# Patient Record
Sex: Female | Born: 1955 | Race: White | Hispanic: No | Marital: Married | Smoking: Former smoker
Health system: Southern US, Community
[De-identification: ages and names within clinical notes are randomized; demographics above are authoritative.]

## PROBLEM LIST (undated history)

## (undated) DIAGNOSIS — R569 Unspecified convulsions: Secondary | ICD-10-CM

## (undated) DIAGNOSIS — T7840XA Allergy, unspecified, initial encounter: Secondary | ICD-10-CM

## (undated) DIAGNOSIS — G43909 Migraine, unspecified, not intractable, without status migrainosus: Secondary | ICD-10-CM

## (undated) DIAGNOSIS — G40909 Epilepsy, unspecified, not intractable, without status epilepticus: Secondary | ICD-10-CM

## (undated) DIAGNOSIS — M8580 Other specified disorders of bone density and structure, unspecified site: Secondary | ICD-10-CM

## (undated) DIAGNOSIS — M810 Age-related osteoporosis without current pathological fracture: Secondary | ICD-10-CM

## (undated) HISTORY — PX: TUBAL LIGATION: SHX77

## (undated) HISTORY — DX: Unspecified convulsions: R56.9

## (undated) HISTORY — DX: Other specified disorders of bone density and structure, unspecified site: M85.80

## (undated) HISTORY — DX: Migraine, unspecified, not intractable, without status migrainosus: G43.909

## (undated) HISTORY — DX: Age-related osteoporosis without current pathological fracture: M81.0

## (undated) HISTORY — DX: Epilepsy, unspecified, not intractable, without status epilepticus: G40.909

## (undated) HISTORY — DX: Allergy, unspecified, initial encounter: T78.40XA

---

## 2000-07-10 ENCOUNTER — Other Ambulatory Visit: Admission: RE | Admit: 2000-07-10 | Discharge: 2000-07-10 | Payer: Self-pay | Admitting: Obstetrics and Gynecology

## 2009-07-06 ENCOUNTER — Emergency Department (HOSPITAL_COMMUNITY): Admission: EM | Admit: 2009-07-06 | Discharge: 2009-07-06 | Payer: Self-pay | Admitting: Emergency Medicine

## 2009-07-06 IMAGING — CT CT NECK W/ CM
3 series · 16 of 33 positions shown, 19 images · IV contrast (100 ML OMNI 300)
Comparison: None

CLINICAL DATA: Neck pain and swelling.  Difficulty swallowing.
Evaluate for abscess.

CT NECK WITH CONTRAST
TECHNIQUE: Multidetector CT imaging of the neck was performed with
intravenous contrast.
Contrast: 100 ml [3M]

[Series 2: 2cc/(id)/1cc/(id) · axial · 0.51mm/px · z∈[+37,+243]mm · 8 of 65 slices shown, 10 images]
[im 5/65  soft-tissue]
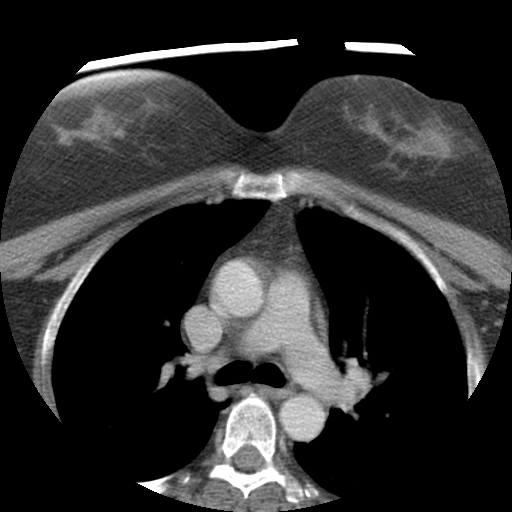
[im 5/65  bone]
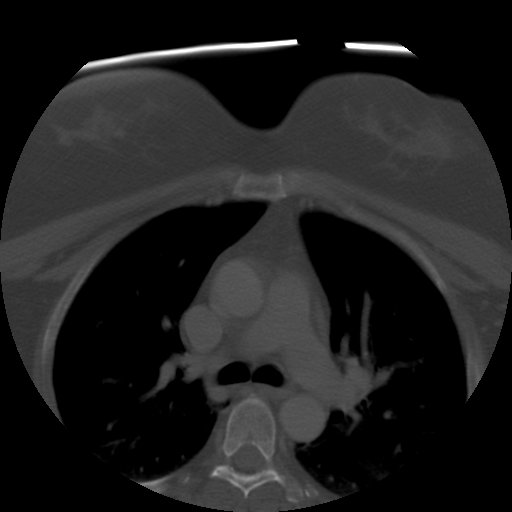
[im 15/65  bone]
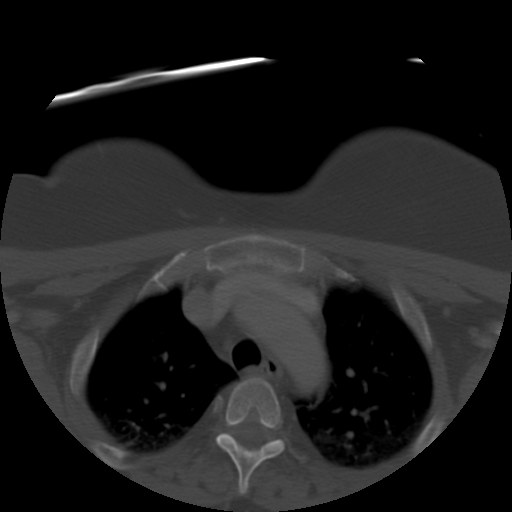
[im 20/65  bone]
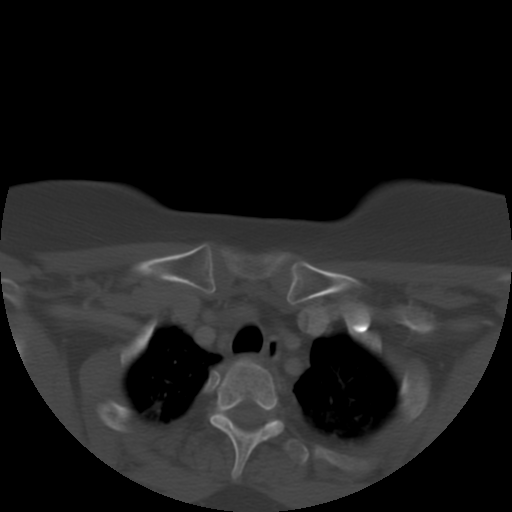
[im 30/65  bone]
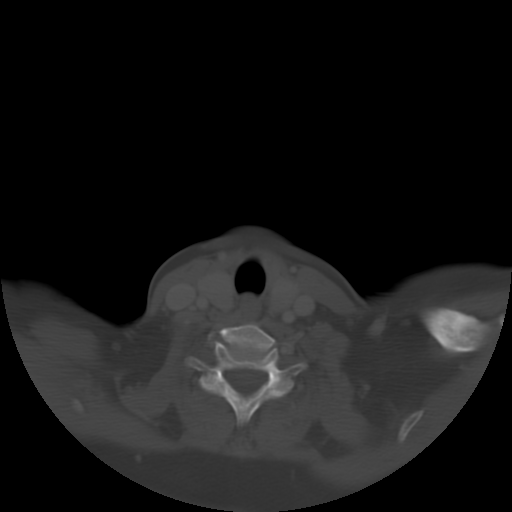
[im 35/65  soft-tissue]
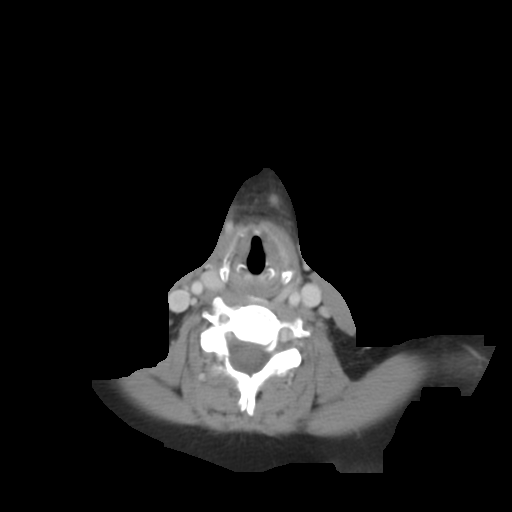
[im 35/65  bone]
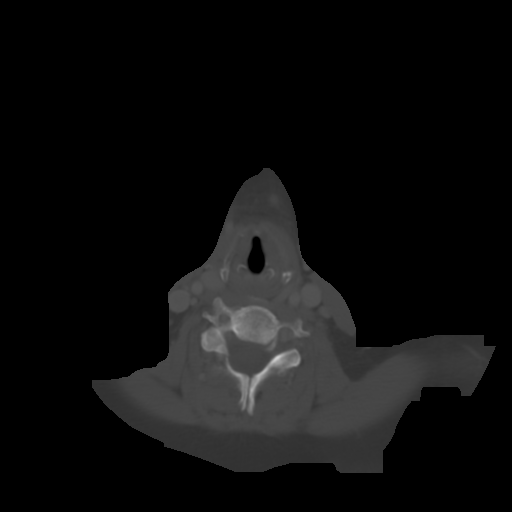
[im 45/65  bone]
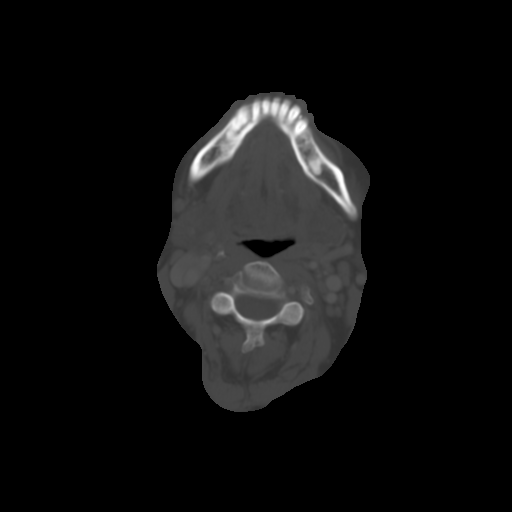
[im 50/65  bone]
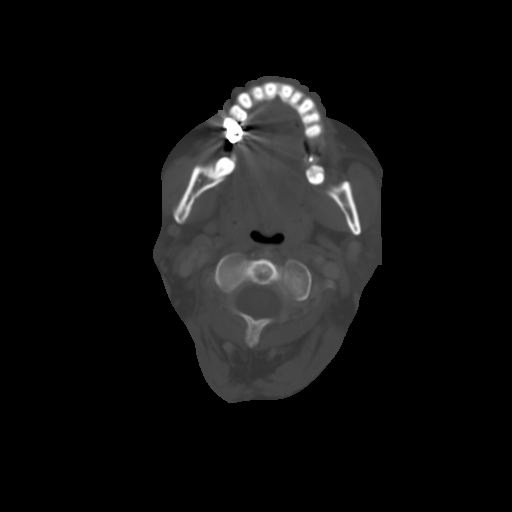
[im 60/65  bone]
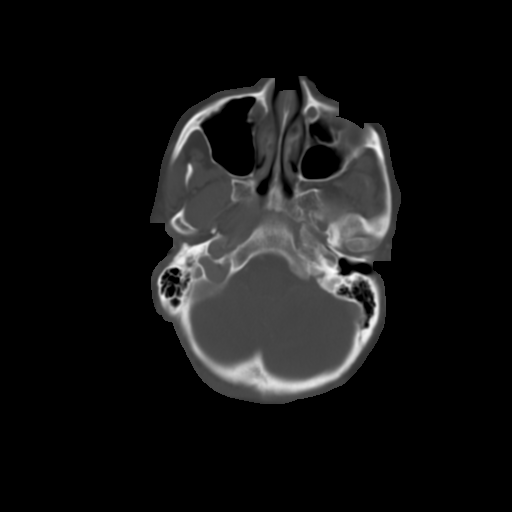

[Series 300: reformatted · sagittal · 0.51mm/px · 5 of 70 slices shown, 6 images (1 of 2)]
[im 24/70  bone]
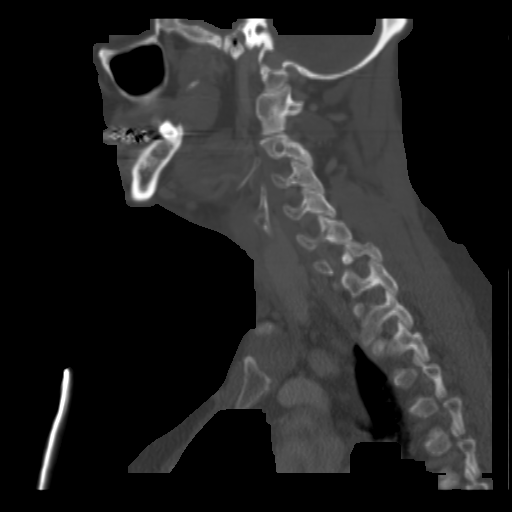
[im 29/70  bone]
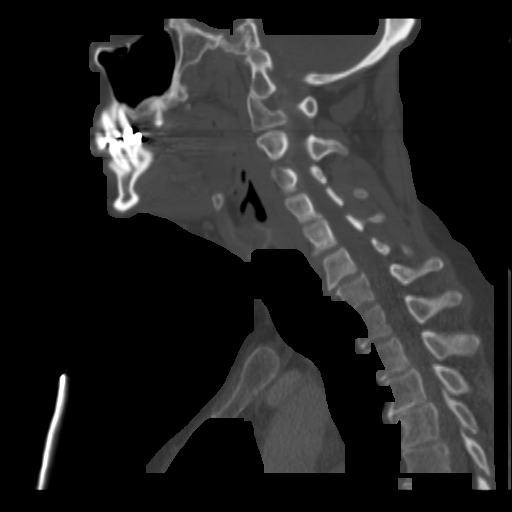
[im 35/70  soft-tissue]
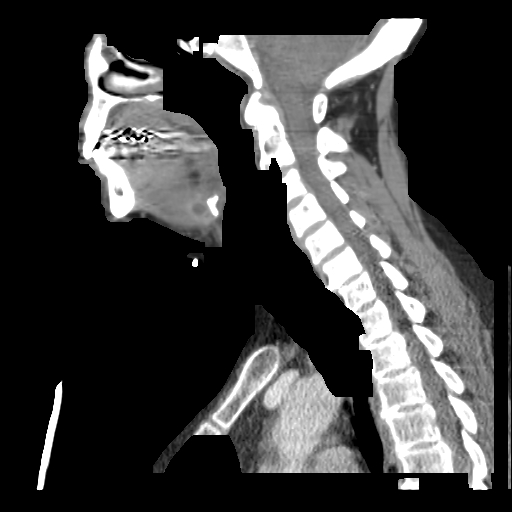
[im 35/70  bone]
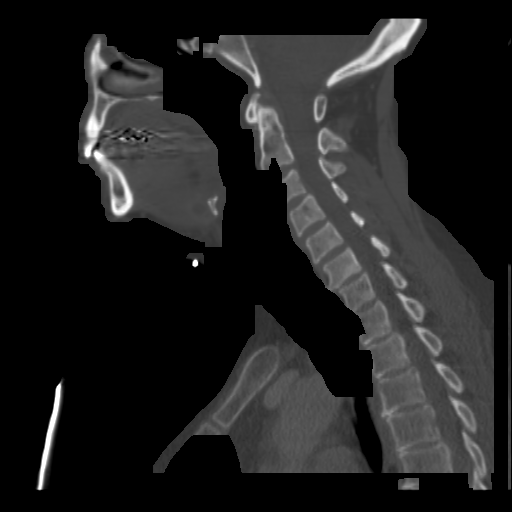
[im 41/70  bone]
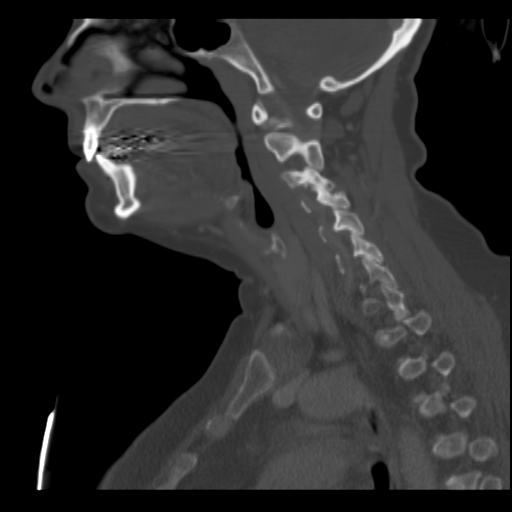
[im 47/70  bone]
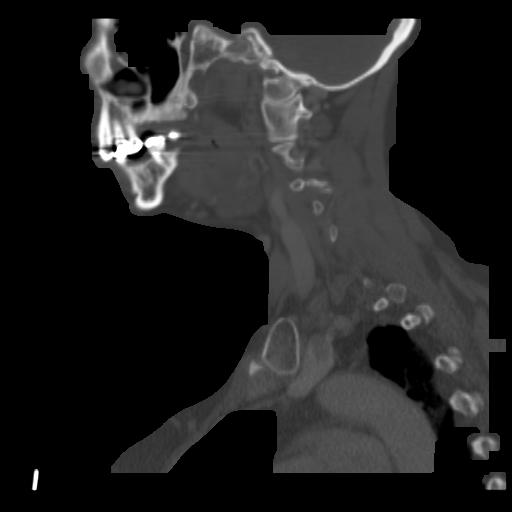

[Series 301: reformatted · coronal · 0.51mm/px · 3 of 93 slices shown (2 of 2)]
[im 19/93  bone]
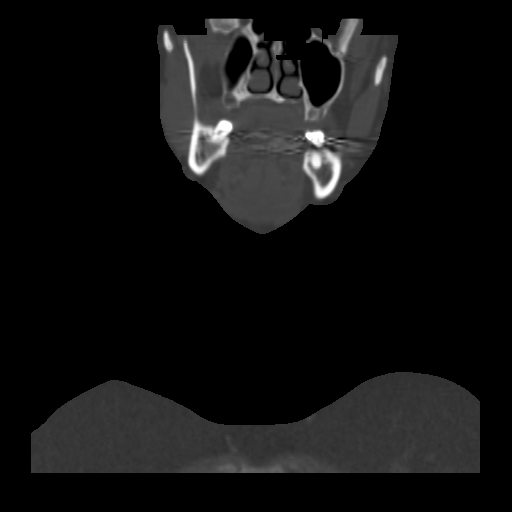
[im 37/93  bone]
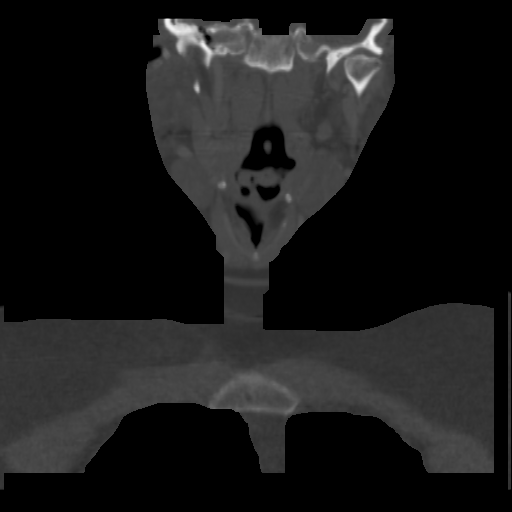
[im 56/93  bone]
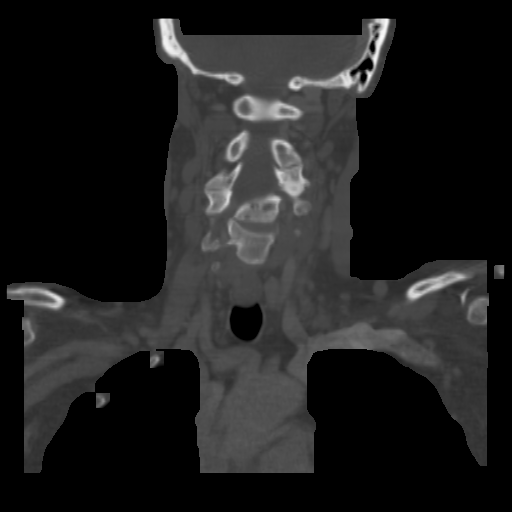

[16 of 33 positions shown; findings below may reference images not displayed]

FINDINGS: A small rim enhancing cyst measuring 1 cm is seen in the
midline along the anterior aspect of the hyoid, consistent with a
thyroglossal duct cyst.

No other abnormal fluid collections or abscess identified within
the neck.  No inflammatory process is identified.  The salivary
glands and thyroid are normal appearance.  The larynx and
epiglottis also appear normal.  Parapharyngeal soft tissue planes
are unremarkable appearance.  Shotty adenopathy is seen along the
jugular and carotid chains bilaterally, however no pathologically
enlarged nodes are identified.
IMPRESSION: 1.  No evidence of abscess.
2.  1 cm midline thyroglossal duct cyst at level of the hyoid bone.

## 2011-01-09 LAB — COMPREHENSIVE METABOLIC PANEL
Calcium: 8.7 mg/dL (ref 8.4–10.5)
Chloride: 105 mEq/L (ref 96–112)
Creatinine, Ser: 0.6 mg/dL (ref 0.4–1.2)
GFR calc Af Amer: >60 mL/min (ref >60)
GFR calc non Af Amer: >60 mL/min (ref >60)
Potassium: 3.5 mEq/L (ref 3.5–5.1)

## 2011-01-09 LAB — POCT I-STAT, CHEM 8
BUN: 12 mg/dL (ref 6–23)
Creatinine, Ser: 0.7 mg/dL (ref 0.4–1.2)
Glucose, Bld: 88 mg/dL (ref 70–99)
Sodium: 138 mEq/L (ref 135–145)
TCO2: 25 mmol/L (ref 0–100)

## 2011-01-09 LAB — DIFFERENTIAL
Basophils Absolute: 0.1 10*3/uL (ref 0.0–0.1)
Basophils Relative: 1 % (ref 0–1)
Eosinophils Absolute: 0.2 10*3/uL (ref 0.0–0.7)
Eosinophils Relative: 2 % (ref 0–5)
Lymphocytes Relative: 21 % (ref 12–46)
Lymphs Abs: 2 10*3/uL (ref 0.7–4.0)
Monocytes Absolute: 1.1 10*3/uL — ABNORMAL HIGH (ref 0.1–1.0)

## 2011-01-09 LAB — CBC
HCT: 36 % (ref 36.0–46.0)
RBC: 3.78 MIL/uL — ABNORMAL LOW (ref 3.87–5.11)
WBC: 9.4 10*3/uL (ref 4.0–10.5)

## 2011-08-08 ENCOUNTER — Other Ambulatory Visit: Payer: Self-pay | Admitting: Nurse Practitioner

## 2011-08-08 DIAGNOSIS — Z79899 Other long term (current) drug therapy: Secondary | ICD-10-CM

## 2011-08-18 ENCOUNTER — Ambulatory Visit
Admission: RE | Admit: 2011-08-18 | Discharge: 2011-08-18 | Disposition: A | Discharge status: Home / Self Care | Payer: 59 | Source: Ambulatory Visit | Attending: Nurse Practitioner | Admitting: Nurse Practitioner

## 2011-08-18 DIAGNOSIS — Z79899 Other long term (current) drug therapy: Secondary | ICD-10-CM

## 2011-10-02 ENCOUNTER — Ambulatory Visit (INDEPENDENT_AMBULATORY_CARE_PROVIDER_SITE_OTHER): Payer: 59

## 2011-10-02 DIAGNOSIS — J019 Acute sinusitis, unspecified: Secondary | ICD-10-CM

## 2011-10-02 DIAGNOSIS — J111 Influenza due to unidentified influenza virus with other respiratory manifestations: Secondary | ICD-10-CM

## 2011-10-13 ENCOUNTER — Ambulatory Visit: Payer: Worker's Compensation

## 2011-10-20 ENCOUNTER — Ambulatory Visit: Payer: Worker's Compensation

## 2011-10-27 ENCOUNTER — Encounter: Payer: Self-pay | Admitting: Family Medicine

## 2011-10-27 DIAGNOSIS — G40909 Epilepsy, unspecified, not intractable, without status epilepticus: Secondary | ICD-10-CM | POA: Insufficient documentation

## 2011-10-27 DIAGNOSIS — J309 Allergic rhinitis, unspecified: Secondary | ICD-10-CM | POA: Insufficient documentation

## 2011-11-16 ENCOUNTER — Ambulatory Visit (INDEPENDENT_AMBULATORY_CARE_PROVIDER_SITE_OTHER): Payer: 59 | Admitting: Physician Assistant

## 2011-11-16 VITALS — BP 117/84 | HR 80 | Temp 98.2°F | Resp 12 | Ht 64.5 in | Wt 201.0 lb

## 2011-11-16 DIAGNOSIS — J019 Acute sinusitis, unspecified: Secondary | ICD-10-CM

## 2011-11-16 DIAGNOSIS — F17200 Nicotine dependence, unspecified, uncomplicated: Secondary | ICD-10-CM

## 2011-11-16 MED ORDER — GUAIFENESIN ER 1200 MG PO TB12: 1.0000 | ORAL_TABLET | Freq: Two times a day (BID) | ORAL | Status: DC | PRN

## 2011-11-16 MED ORDER — CEFDINIR 300 MG PO CAPS: 600.0000 mg | ORAL_CAPSULE | Freq: Every day | ORAL | Status: AC

## 2011-11-16 NOTE — Progress Notes (Signed)
  Subjective:    Patient ID: Chelsea Walsh, female    DOB: [birth year], 56 y.o.   MRN: [id number]  HPI The patient presents with approximately one week of illness. Actually, she states she never completely improved from similar symptoms for which she was seen 10/02/2011. At that visit she was diagnosed with sinusitis and prescribed amoxicillin. She was also prescribed Patanase nasal spray which she used temporarily but has since discontinued. She continues to smoke, has plans to quit smoking this summer.   Congestion, postnasal drainage, cough. Facial pain and pressure. Nasal congestion and drainage.  No GI or GU symptoms. No fever or chills. No sore throat, ear fullness or ear pain. No myalgias, arthralgias or rash. Review of Systems As above, otherwise negative.    Objective:   Physical Exam  Vitals reviewed. Constitutional: She is oriented to person, place, and time. Vital signs are normal. She appears well-developed and well-nourished. No distress.  HENT:  Head: Normocephalic and atraumatic.  Right Ear: Hearing, tympanic membrane, external ear and ear canal normal.  Left Ear: Hearing, tympanic membrane, external ear and ear canal normal.  Nose: Right sinus exhibits maxillary sinus tenderness and frontal sinus tenderness. Left sinus exhibits maxillary sinus tenderness and frontal sinus tenderness.  Mouth/Throat: Uvula is midline, oropharynx is clear and moist and mucous membranes are normal. No oropharyngeal exudate.  Eyes: Conjunctivae and EOM are normal. Pupils are equal, round, and reactive to light. Right eye exhibits no discharge. Left eye exhibits no discharge. No scleral icterus.  Neck: Trachea normal, normal range of motion and full passive range of motion without pain. Neck supple. No mass and no thyromegaly present.  Cardiovascular: Normal rate, regular rhythm and normal heart sounds.   Pulmonary/Chest: Effort normal and breath sounds normal.  Musculoskeletal: Normal range of  motion.       Cervical back: Normal.  Lymphadenopathy:       Head (right side): No tonsillar, no preauricular, no posterior auricular and no occipital adenopathy present.       Head (left side): No tonsillar, no preauricular, no posterior auricular and no occipital adenopathy present.    She has no cervical adenopathy.       Right cervical: No superficial cervical, no deep cervical and no posterior cervical adenopathy present.      Left cervical: No superficial cervical, no deep cervical and no posterior cervical adenopathy present.       Right: No supraclavicular adenopathy present.       Left: No supraclavicular adenopathy present.  Neurological: She is alert and oriented to person, place, and time. No cranial nerve deficit.  Skin: Skin is warm and dry. No rash noted.  Psychiatric: She has a normal mood and affect. Her speech is normal and behavior is normal. Judgment and thought content normal.          Assessment & Plan:  1. Sinusitis. Cefdinir 300 mg, 2 capsules by mouth daily x10 days. Mucinex maximum strength twice a day. Resume 10 days nasal spray. Rest, fluids.  2. Tobacco abuse. Encouraged smoking cessation.

## 2011-11-16 NOTE — Patient Instructions (Signed)
Re-start the Patanase nasal spray you were given for your illness 10/01/2012. Get lots of rest and drink at least 64 ounces of water daily.

## 2012-04-16 ENCOUNTER — Ambulatory Visit: Payer: 59

## 2012-05-03 ENCOUNTER — Encounter: Payer: Self-pay | Admitting: Family Medicine

## 2012-05-03 ENCOUNTER — Ambulatory Visit: Payer: Self-pay | Admitting: Family Medicine

## 2012-05-03 ENCOUNTER — Ambulatory Visit: Payer: Self-pay

## 2012-05-03 VITALS — BP 127/84 | HR 88 | Temp 98.0°F | Resp 16 | Ht 64.0 in | Wt 203.0 lb

## 2012-05-03 DIAGNOSIS — R9389 Abnormal findings on diagnostic imaging of other specified body structures: Secondary | ICD-10-CM

## 2012-05-03 DIAGNOSIS — R918 Other nonspecific abnormal finding of lung field: Secondary | ICD-10-CM

## 2012-05-03 NOTE — Progress Notes (Signed)
[facility] [address], [place] [address] [phone]  Date:  05/03/2012   Name:  Chelsea Walsh   DOB:  03-22-1956   MRN:  [id number]  PCP:  No primary provider on file.    Chief Complaint: xray   History of Present Illness:  Chelsea Walsh is a 56 y.o. very pleasant female patient who presents with the following:  Needs a chest x-ray to follow- up prominent right paratracheal region noted on T-spine film done earlier this month  Patient Active Problem List  Diagnosis  . Allergic rhinitis  . Seizure disorder    Past Medical History  Diagnosis Date  . Epilepsy     last seizure 2000  . Allergy   . Seizures     No past surgical history on file.  History  Substance Use Topics  . Smoking status: Current Everyday Smoker -- 0.7 packs/day for 4 years    Types: Cigarettes  . Smokeless tobacco: Never Used  . Alcohol Use: Not on file    No family history on file.  Allergies  Allergen Reactions  . Tegretol (Carbamazepine) Hives    Medication list has been reviewed and updated.  Current Outpatient Prescriptions on File Prior to Visit  Medication Sig Dispense Refill  . calcium-vitamin D (OSCAL WITH D) 500-200 MG-UNIT per tablet Take 1 tablet by mouth daily.      . Guaifenesin (MUCINEX MAXIMUM STRENGTH) 1200 MG TB12 Take 1 tablet (1,200 mg total) by mouth every 12 (twelve) hours as needed.  14 tablet  1  . levETIRAcetam (KEPPRA) 500 MG tablet Take 100 mg by mouth every 12 (twelve) hours. 1 in am and 2 hs      . Multiple Vitamins-Minerals (MULTIVITAMIN WITH MINERALS) tablet Take 1 tablet by mouth daily.      . phenytoin (DILANTIN) 300 MG ER capsule Take 300 mg by mouth 2 (two) times daily.        Review of Systems:  As per HPI- otherwise negative.   Physical Examination: Filed Vitals:   05/03/12 1516  BP: 127/84  Pulse: 88  Temp: 98 F (36.7 C)  Resp: 16   Filed Vitals:   05/03/12 1516  Height: 5\' 4"  (1.626 m)  Weight: 203 lb (92.08  kg)   Body mass index is 34.84 kg/(m^2). Ideal Body Weight: Weight in (lb) to have BMI = 25: 145.3   GEN: WDWN, NAD, Non-toxic, A & O x 3 HEENT: Atraumatic, Normocephalic. Neck supple. No masses, No LAD. Ears and Nose: No external deformity. CV: RRR, No M/G/R. No JVD. No thrill. No extra heart sounds. PULM: CTA B, no wheezes, crackles, rhonchi. No retractions. No resp. distress. No accessory muscle use. EXTR: No c/c/e NEURO Normal gait.  PSYCH: Normally interactive. Conversant. Not depressed or anxious appearing.  Calm demeanor.    [facility] reading (PRIMARY) by  Dr. Patsy Lager.  Normal CXR- compare with thoracic spine film dated 04/23/12    Assessment and Plan: 1. Abnormal CXR (chest x-ray)  DG Chest 2 View  await over- read report but looks reassuring  COPLAND,JESSICA, MD

## 2013-12-18 ENCOUNTER — Other Ambulatory Visit: Payer: Self-pay | Admitting: Nurse Practitioner

## 2013-12-26 ENCOUNTER — Ambulatory Visit (INDEPENDENT_AMBULATORY_CARE_PROVIDER_SITE_OTHER): Payer: Self-pay | Admitting: Diagnostic Neuroimaging

## 2013-12-26 ENCOUNTER — Encounter (INDEPENDENT_AMBULATORY_CARE_PROVIDER_SITE_OTHER): Payer: Self-pay

## 2013-12-26 ENCOUNTER — Encounter: Payer: Self-pay | Admitting: Diagnostic Neuroimaging

## 2013-12-26 VITALS — BP 131/89 | HR 67 | Ht 65.0 in | Wt 227.0 lb

## 2013-12-26 DIAGNOSIS — G40109 Localization-related (focal) (partial) symptomatic epilepsy and epileptic syndromes with simple partial seizures, not intractable, without status epilepticus: Secondary | ICD-10-CM

## 2013-12-26 MED ORDER — LEVETIRACETAM 500 MG PO TABS: 1500.0000 mg | ORAL_TABLET | Freq: Two times a day (BID) | ORAL | Status: DC

## 2013-12-26 NOTE — Progress Notes (Signed)
[facility]  PATIENT: Chelsea Walsh DOB: 1956/07/27  REFERRING CLINICIAN:  HISTORY FROM: patient  REASON FOR VISIT: follow up   HISTORICAL  CHIEF COMPLAINT:  Chief Complaint  Patient presents with  . Follow-up    sz    HISTORY OF PRESENT ILLNESS:   UPDATE 12/26/13: Patient returns for followup. She's doing well. No further seizures. Patient reports first seizure of life in 1993. Last seizure was in 2001. Patient has been on phenobarbital, Tegretol, Dilantin and Keppra in the past. Currently on Keppra 1500 mg twice a day. Typical seizures consist of olfactory are of ammonia smell followed by staring spell, confusion, sometimes generalized convulsions. She recalls unilateral convulsions at some point in her life, but does not recall what side of her body.  UPDATE 11/17/12: Chelsea Walsh, 58 year old white female returns today for followup. 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 She also has a history of headaches. She returns today  stating that she has had no seizure activity for 13 years. She has been followed at our office since 1993. 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 Rarely have they progressed to generalized seizures. EEG in 1994 an MRI of the brain were interpreted as normal. She has had multiple medication trials in the past including Tegretol for which she developed a rash. She is currently on Dilantin and Keppra without difficulty or side effects. She says that she sometimes does not take her Dilantin because of insurance issues. Her Dexa scan last year shows osteopenia. She is currently taking added calcium and Vitamin D. She denies any confusion or lapses of time, any daytime drowsiness and feelings of being off balance. She does have a thyroid disorder and occasional swelling of the feet, she also has a cyst  in her throat and has caused some  swallowing difficulties. No new neurological complaints.  REVIEW OF SYSTEMS: Full 14 system review of systems performed and notable only for environmental allergies joint pain snoring ringing in ears.  ALLERGIES: Allergies  Allergen Reactions  . Tegretol [Carbamazepine] Hives    HOME MEDICATIONS: Prior to Admission medications   Medication Sig Start Date End Date Taking? Authorizing Provider  calcium-vitamin D (OSCAL WITH D) 500-200 MG-UNIT per tablet Take 1 tablet by mouth daily.   Yes Historical Provider, MD  levETIRAcetam (KEPPRA) 500 MG tablet Take 3 tablets (1,500 mg total) by mouth 2 (two) times daily.   Yes Penni Bombard, MD  Multiple Vitamins-Minerals (MULTIVITAMIN WITH MINERALS) tablet Take 1 tablet by mouth daily.   Yes Historical Provider, MD    PAST MEDICAL HISTORY: Past Medical History  Diagnosis Date  . Epilepsy     last seizure 2000  . Allergy   . Seizures   . Migraine headache   . Osteopenia     PAST SURGICAL HISTORY: History reviewed. No pertinent past surgical history.  FAMILY HISTORY: History reviewed. No pertinent family history.  SOCIAL HISTORY:  History   Social History  . Marital Status: Single    Spouse Name: N/A    Number of Children: 3  . Years of Education: College   Occupational History  . 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  Other    [employer]   Social History Main Topics  . Smoking status: Current Every Day Smoker -- 0.70 packs/day for 4 years  Types: Cigarettes  . Smokeless tobacco: Never Used  . Alcohol Use: No  . Drug Use: No  . Sexual Activity: Not on file   Other Topics Concern  . Not on file   Social History Narrative   Caffeine Use:      PHYSICAL EXAM  Filed Vitals:   12/26/13 1422  BP: 131/89  Pulse: 67  Height: 5' 5"  (1.651 m)  Weight: 227 lb (102.967 kg)    Not recorded    Body mass index is 37.77 kg/(m^2).  GENERAL EXAM: Patient is in no distress; well  developed, nourished and groomed; neck is supple  CARDIOVASCULAR: Regular rate and rhythm, no murmurs, no carotid bruits  NEUROLOGIC: MENTAL STATUS: awake, alert, oriented to person, place and time, recent and remote memory intact, normal attention and concentration, language fluent, comprehension intact, naming intact, fund of knowledge appropriate CRANIAL NERVE: no papilledema on fundoscopic exam, pupils equal and reactive to light, visual fields full to confrontation, extraocular muscles intact, no nystagmus, facial sensation and strength symmetric, hearing intact, palate elevates symmetrically, uvula midline, shoulder shrug symmetric, tongue midline. MOTOR: normal bulk and tone, full strength in the BUE, BLE SENSORY: normal and symmetric to light touch, pinprick, temperature, vibration and proprioception COORDINATION: finger-nose-finger, fine finger movements, heel-shin normal REFLEXES: deep tendon reflexes present and symmetric GAIT/STATION: narrow based gait; able to walk on toes, heels and tandem; romberg is negative    DIAGNOSTIC DATA (LABS, IMAGING, TESTING) - I reviewed patient records, labs, notes, testing and imaging myself where available.  Lab Results  Component Value Date   WBC 9.4 07/06/2009   HGB 13.3 07/06/2009   HCT 39.0 07/06/2009   MCV 95.3 07/06/2009   PLT 253 07/06/2009      Component Value Date/Time   NA 138 07/06/2009 1940   K 3.9 07/06/2009 1940   CL 103 07/06/2009 1940   CO2 26 07/06/2009 1829   GLUCOSE 88 07/06/2009 1940   BUN 12 07/06/2009 1940   CREATININE 0.7 07/06/2009 1940   CALCIUM 8.7 07/06/2009 1829   PROT 6.4 07/06/2009 1829   ALBUMIN 3.8 07/06/2009 1829   AST 17 07/06/2009 1829   ALT 17 07/06/2009 1829   ALKPHOS 161* 07/06/2009 1829   BILITOT 0.5 07/06/2009 1829   GFRNONAA >60 07/06/2009 1829   GFRAA  Value: >60        The eGFR has been calculated using the MDRD equation. This calculation has not been validated in all clinical situations. eGFR's  persistently <60 mL/min signify possible Chronic Kidney Disease. 07/06/2009 1829   No results found for this basename: CHOL, HDL, LDLCALC, LDLDIRECT, TRIG, CHOLHDL   No results found for this basename: HGBA1C   No results found for this basename: VITAMINB12   No results found for this basename: TSH    01/17/93 MRI brain - normal   01/18/93 EEG - normal  03/07/94 EEG - normal  11/03/96 EEG - normal   ASSESSMENT AND PLAN  58 y.o. year old female here with history of complex partial seizures with secondary generalization since 1993. No seizures since 2001. Over past year has done well on monotherapy with levetiracetam 1500 mg twice a day. We'll continue current medication for now. In the future we may consider further tapering. She would be a good candidate given her long seizure free time frame and normal MRI/EEGs. I will continue current plan for now because I am meeting her for the first time today and getting to better know her and her medical/neurologic history.  PLAN: - continue LEV 1573m BID  Return in about 1 year (around 12/27/2014) for with LCharlott Holleror Modell Fendrick.   VPenni Bombard MD [id number]/75/7972 28:20PM Certified in Neurology, Neurophysiology and Neuroimaging  [facility] [address] [place] [phone]

## 2013-12-26 NOTE — Patient Instructions (Signed)
Continue current medications. 

## 2014-08-21 ENCOUNTER — Encounter: Payer: Self-pay | Admitting: Diagnostic Neuroimaging

## 2014-12-27 ENCOUNTER — Ambulatory Visit: Payer: Self-pay | Admitting: Diagnostic Neuroimaging

## 2014-12-27 ENCOUNTER — Ambulatory Visit: Payer: Self-pay | Admitting: Nurse Practitioner

## 2015-02-05 ENCOUNTER — Ambulatory Visit (INDEPENDENT_AMBULATORY_CARE_PROVIDER_SITE_OTHER): Payer: Self-pay | Admitting: Diagnostic Neuroimaging

## 2015-02-05 ENCOUNTER — Encounter: Payer: Self-pay | Admitting: Diagnostic Neuroimaging

## 2015-02-05 VITALS — BP 133/94 | HR 86 | Ht 64.0 in | Wt 241.0 lb

## 2015-02-05 DIAGNOSIS — G40109 Localization-related (focal) (partial) symptomatic epilepsy and epileptic syndromes with simple partial seizures, not intractable, without status epilepticus: Secondary | ICD-10-CM

## 2015-02-05 MED ORDER — LEVETIRACETAM 1000 MG PO TABS: 1000.0000 mg | ORAL_TABLET | Freq: Two times a day (BID) | ORAL | Status: DC

## 2015-02-05 NOTE — Progress Notes (Signed)
[facility]  PATIENT: Chelsea Walsh DOB: October 23, 1955  REFERRING CLINICIAN:  HISTORY FROM: patient  REASON FOR VISIT: follow up   HISTORICAL  CHIEF COMPLAINT:  Chief Complaint  Patient presents with  . Follow-up    epilepsy    HISTORY OF PRESENT ILLNESS:   UPDATE 02/05/15: Since last visit, doing well. No sz. Tolerating LEV 1571m BID. Still working and driving.  UPDATE 12/26/13: Patient returns for followup. She's doing well. No further seizures. Patient reports first seizure of life in 1993. Last seizure was in 2001. Patient has been on phenobarbital, Tegretol, Dilantin and Keppra in the past. Currently on Keppra 1500 mg twice a day. Typical seizures consist of olfactory are of ammonia smell followed by staring spell, confusion, sometimes generalized convulsions. She recalls unilateral convulsions at some point in her life, but does not recall what side of her body.  UPDATE 11/17/12: Chelsea Walsh 59year old white female returns today for followup. 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 She also has a history of headaches. She returns today  stating that she has had no seizure activity for 13 years. She has been followed at our office since 1993. 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 Rarely have they progressed to generalized seizures. EEG in 1994 an MRI of the brain were interpreted as normal. She has had multiple medication trials in the past including Tegretol for which she developed a rash. She is currently on Dilantin and Keppra without difficulty or side effects. She says that she sometimes does not take her Dilantin because of insurance issues. Her Dexa scan last year shows osteopenia. She is currently taking added calcium and Vitamin D. She denies any confusion or lapses of time, any daytime drowsiness and  feelings of being off balance. She does have a thyroid disorder and occasional swelling of the feet, she also has a cyst in her throat and has caused some  swallowing difficulties. No new neurological complaints.  REVIEW OF SYSTEMS: Full 14 system review of systems performed and notable only for joint pain snoring ringing in ears.  ALLERGIES: Allergies  Allergen Reactions  . Tegretol [Carbamazepine] Hives    HOME MEDICATIONS: Outpatient Prescriptions Prior to Visit  Medication Sig Dispense Refill  . calcium-vitamin D (OSCAL WITH D) 500-200 MG-UNIT per tablet Take 1 tablet by mouth daily.    . Multiple Vitamins-Minerals (MULTIVITAMIN WITH MINERALS) tablet Take 1 tablet by mouth daily.    .Marland KitchenlevETIRAcetam (KEPPRA) 500 MG tablet Take 3 tablets (1,500 mg total) by mouth 2 (two) times daily. 540 tablet 4   No facility-administered medications prior to visit.    PAST MEDICAL HISTORY: Past Medical History  Diagnosis Date  . Epilepsy     last seizure 2000  . Allergy   . Seizures   . Migraine headache   . Osteopenia     PAST SURGICAL HISTORY: No past surgical history on file.  FAMILY HISTORY: No family history on file.  SOCIAL HISTORY:  History   Social History  . Marital Status: Single    Spouse Name: Chelsea Walsh . Number of Children: 3  . Years of Education: College   Occupational History  . 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  Other    [employer]   Social History Main Topics  . Smoking status: Former Smoker -- 0.70 packs/day  for 4 years    Types: Cigarettes    Quit date: 02/03/2013  . Smokeless tobacco: Never Used  . Alcohol Use: No  . Drug Use: No  . Sexual Activity: Not on file   Other Topics Concern  . Not on file   Social History Narrative   Lives at home with husband   Drinks caffeine: 1-2 cups of coffee and 1 soda a day      PHYSICAL EXAM  Filed Vitals:   02/05/15 1408  BP: 133/94  Pulse: 86  Height: 5' 4"  (1.626 m)  Weight: 241 lb (109.317  kg)    Not recorded      Body mass index is 41.35 kg/(m^2).  GENERAL EXAM: Patient is in no distress; well developed, nourished and groomed; neck is supple  CARDIOVASCULAR: Regular rate and rhythm, no murmurs, no carotid bruits  NEUROLOGIC: MENTAL STATUS: awake, alert, language fluent, comprehension intact, naming intact, fund of knowledge appropriate CRANIAL NERVE: pupils equal and reactive to light, visual fields full to confrontation, extraocular muscles intact, no nystagmus, facial sensation and strength symmetric, hearing intact, palate elevates symmetrically, uvula midline, shoulder shrug symmetric, tongue midline. MOTOR: normal bulk and tone, full strength in the BUE, BLE SENSORY: normal and symmetric to light touch COORDINATION: finger-nose-finger, fine finger movements normal REFLEXES: deep tendon reflexes present and symmetric GAIT/STATION: narrow based gait; able to walk tandem; romberg is negative    DIAGNOSTIC DATA (LABS, IMAGING, TESTING) - I reviewed patient records, labs, notes, testing and imaging myself where available.  Lab Results  Component Value Date   WBC 9.4 07/06/2009   HGB 13.3 07/06/2009   HCT 39.0 07/06/2009   MCV 95.3 07/06/2009   PLT 253 07/06/2009      Component Value Date/Time   NA 138 07/06/2009 1940   K 3.9 07/06/2009 1940   CL 103 07/06/2009 1940   CO2 26 07/06/2009 1829   GLUCOSE 88 07/06/2009 1940   BUN 12 07/06/2009 1940   CREATININE 0.7 07/06/2009 1940   CALCIUM 8.7 07/06/2009 1829   PROT 6.4 07/06/2009 1829   ALBUMIN 3.8 07/06/2009 1829   AST 17 07/06/2009 1829   ALT 17 07/06/2009 1829   ALKPHOS 161* 07/06/2009 1829   BILITOT 0.5 07/06/2009 1829   GFRNONAA >60 07/06/2009 1829   GFRAA  07/06/2009 1829    >60        The eGFR has been calculated using the MDRD equation. This calculation has not been validated in all clinical situations. eGFR's persistently <60 mL/min signify possible Chronic Kidney Disease.   No  results found for: CHOL No results found for: HGBA1C No results found for: VITAMINB12 No results found for: TSH  01/17/93 MRI brain - normal   01/18/93 EEG - normal  03/07/94 EEG - normal  11/03/96 EEG - normal    ASSESSMENT AND PLAN  59 y.o. year old female here with history of complex partial seizures with secondary generalization since 1993. No seizures since 2001. Has done well on monotherapy with levetiracetam 1500 mg twice a day.   Will try lower LEV 106m BID dosing. Temporary driving restriction x 2 months.   PLAN: - reduce LEV to 1001mBID - no driving until seizure free x 2 months on lower dose  Return in about 6 months (around 08/08/2015).  I spent 15 minutes of face to face time with patient. Greater than 50% of time was spent in counseling and coordination of care with patient.    VIPenni BombardMD  5/0/1586, 8:25 PM Certified in Neurology, Neurophysiology and [facility] [address] [place], [place] [address] [phone]

## 2015-02-05 NOTE — Patient Instructions (Signed)
Reduce levetiracetam to 1000mg  twice a day.  No driving for 2 months. If no seizures, then may return to driving.

## 2015-08-13 ENCOUNTER — Ambulatory Visit: Payer: Self-pay | Admitting: Diagnostic Neuroimaging

## 2016-01-28 ENCOUNTER — Ambulatory Visit: Payer: Self-pay | Admitting: Diagnostic Neuroimaging

## 2016-02-27 ENCOUNTER — Encounter: Payer: Self-pay | Admitting: Diagnostic Neuroimaging

## 2016-02-27 ENCOUNTER — Ambulatory Visit (INDEPENDENT_AMBULATORY_CARE_PROVIDER_SITE_OTHER): Payer: Self-pay | Admitting: Diagnostic Neuroimaging

## 2016-02-27 VITALS — BP 156/96 | HR 75 | Ht 64.0 in | Wt 247.8 lb

## 2016-02-27 DIAGNOSIS — I10 Essential (primary) hypertension: Secondary | ICD-10-CM

## 2016-02-27 DIAGNOSIS — G40109 Localization-related (focal) (partial) symptomatic epilepsy and epileptic syndromes with simple partial seizures, not intractable, without status epilepticus: Secondary | ICD-10-CM

## 2016-02-27 DIAGNOSIS — E669 Obesity, unspecified: Secondary | ICD-10-CM

## 2016-02-27 MED ORDER — LEVETIRACETAM 1000 MG PO TABS: 1000.0000 mg | ORAL_TABLET | Freq: Two times a day (BID) | ORAL | Status: DC

## 2016-02-27 NOTE — Progress Notes (Signed)
[facility]  PATIENT: Chelsea Walsh DOB: 1955-12-15  REFERRING CLINICIAN:  HISTORY FROM: patient  REASON FOR VISIT: follow up   HISTORICAL  CHIEF COMPLAINT:  Chief Complaint  Patient presents with  . Localization-related epilepsy    rm 6, "no seizures in about 16 years"  . Follow-up    yearly    HISTORY OF PRESENT ILLNESS:   UPDATE 02/27/16: Since last visit, doing well. No seizures. Tolerating LEV 1059m BID. Occ anxiety stress issues, but manageble.   UPDATE 02/05/15: Since last visit, doing well. No sz. Tolerating LEV 15059mBID. Still working and driving.  UPDATE 12/26/13: Patient returns for followup. She's doing well. No further seizures. Patient reports first seizure of life in 1993. Last seizure was in 2001. Patient has been on phenobarbital, Tegretol, Dilantin and Keppra in the past. Currently on Keppra 1500 mg twice a day. Typical seizures consist of olfactory are of ammonia smell followed by staring spell, confusion, sometimes generalized convulsions. She recalls unilateral convulsions at some point in her life, but does not recall what side of her body.  UPDATE 11/17/12: [name]60 year old white female returns today for followup. 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 She also has a history of headaches. She returns today  stating that she has had no seizure activity for 13 years. She has been followed at our office since 1993. 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 Rarely have they progressed to generalized seizures. EEG in 1994 an MRI of the brain were interpreted as normal. She has had multiple medication trials in the past including Tegretol for which she developed a rash. She is currently on Dilantin and Keppra without difficulty or side effects. She says that she sometimes does not take her  Dilantin because of insurance issues. Her Dexa scan last year shows osteopenia. She is currently taking added calcium and Vitamin D. She denies any confusion or lapses of time, any daytime drowsiness and feelings of being off balance. She does have a thyroid disorder and occasional swelling of the feet, she also has a cyst in her throat and has caused some  swallowing difficulties. No new neurological complaints.  REVIEW OF SYSTEMS: Full 14 system review of systems performed and negative except: only for joint pain snoring ringing in ears anxiety cramps aching muscles memory loss weight gain.   ALLERGIES: Allergies  Allergen Reactions  . Tegretol [Carbamazepine] Hives    HOME MEDICATIONS: Outpatient Prescriptions Prior to Visit  Medication Sig Dispense Refill  . calcium-vitamin D (OSCAL WITH D) 500-200 MG-UNIT per tablet Take 1 tablet by mouth daily.    . Marland KitchenevETIRAcetam (KEPPRA) 1000 MG tablet Take 1 tablet (1,000 mg total) by mouth 2 (two) times daily. 60 tablet 12  . Multiple Vitamins-Minerals (MULTIVITAMIN WITH MINERALS) tablet Take 1 tablet by mouth daily.     No facility-administered medications prior to visit.    PAST MEDICAL HISTORY: Past Medical History  Diagnosis Date  . [place]    last seizure 2000  . Allergy   . Migraine headache   . Osteopenia   . Seizures ([facility]    02/27/16 about 16 yrs since last sz    PAST SURGICAL HISTORY: No past surgical history on file.  FAMILY HISTORY: No family history on file.  SOCIAL HISTORY:  Social History   Social History  .  Marital Status: Single    Spouse Name: Chelsea Walsh  . Number of Children: 3  . Years of Education: College   Occupational History  . 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  Other    [employer]   Social History Main Topics  . Smoking status: Former Smoker -- 0.70 packs/day for 4 years    Types: Cigarettes    Quit date: 02/03/2013  . Smokeless tobacco: Never Used  . Alcohol Use: No  . Drug Use:  No  . Sexual Activity: Not on file   Other Topics Concern  . Not on file   Social History Narrative   Lives at home with husband   Drinks caffeine: 1-2 cups of coffee and 1 soda a day      PHYSICAL EXAM  Filed Vitals:   02/27/16 1450  BP: 156/96  Pulse: 75  Height: 5' 4"  (1.626 m)  Weight: 247 lb 12.8 oz (112.401 kg)    Not recorded      Body mass index is 42.51 kg/(m^2).  GENERAL EXAM: Patient is in no distress; well developed, nourished and groomed; neck is supple  CARDIOVASCULAR: Regular rate and rhythm, no murmurs, no carotid bruits  NEUROLOGIC: MENTAL STATUS: awake, alert, language fluent, comprehension intact, naming intact, fund of knowledge appropriate CRANIAL NERVE: pupils equal and reactive to light, visual fields full to confrontation, extraocular muscles intact, no nystagmus, facial sensation and strength symmetric, hearing intact, palate elevates symmetrically, uvula midline, shoulder shrug symmetric, tongue midline. MOTOR: normal bulk and tone, full strength in the BUE, BLE SENSORY: normal and symmetric to light touch COORDINATION: finger-nose-finger, fine finger movements normal REFLEXES: deep tendon reflexes present and symmetric GAIT/STATION: narrow based gait; able to walk tandem; romberg is negative    DIAGNOSTIC DATA (LABS, IMAGING, TESTING) - I reviewed patient records, labs, notes, testing and imaging myself where available.  Lab Results  Component Value Date   WBC 9.4 07/06/2009   HGB 13.3 07/06/2009   HCT 39.0 07/06/2009   MCV 95.3 07/06/2009   PLT 253 07/06/2009      Component Value Date/Time   NA 138 07/06/2009 1940   K 3.9 07/06/2009 1940   CL 103 07/06/2009 1940   CO2 26 07/06/2009 1829   GLUCOSE 88 07/06/2009 1940   BUN 12 07/06/2009 1940   CREATININE 0.7 07/06/2009 1940   CALCIUM 8.7 07/06/2009 1829   PROT 6.4 07/06/2009 1829   ALBUMIN 3.8 07/06/2009 1829   AST 17 07/06/2009 1829   ALT 17 07/06/2009 1829   ALKPHOS 161*  07/06/2009 1829   BILITOT 0.5 07/06/2009 1829   GFRNONAA >60 07/06/2009 1829   GFRAA  07/06/2009 1829    >60        The eGFR has been calculated using the MDRD equation. This calculation has not been validated in all clinical situations. eGFR's persistently <60 mL/min signify possible Chronic Kidney Disease.   No results found for: CHOL No results found for: HGBA1C No results found for: VITAMINB12 No results found for: TSH  01/17/93 MRI brain - normal   01/18/93 EEG - normal  03/07/94 EEG - normal  11/03/96 EEG - normal    ASSESSMENT AND PLAN  60 y.o. year old female here with history of complex partial seizures with secondary generalization since 1993. No seizures since 2001. Has done well on monotherapy with levetiracetam.   Dx:  Localization-related epilepsy ([place])  Essential hypertension  Obesity    PLAN: - continue LEV 1073m BID -  Now with increasing BP and increasing obesity. Needs PCP follow up. Advised to check BP at home as well. Consider sleep study.   Meds ordered this encounter  Medications  . levETIRAcetam (KEPPRA) 1000 MG tablet    Sig: Take 1 tablet (1,000 mg total) by mouth 2 (two) times daily.    Dispense:  180 tablet    Refill:  4   Return in about 1 year (around 02/26/2017).   Penni Bombard, MD 8/81/1031, 5:94 PM Certified in Neurology, Neurophysiology and Neuroimaging  [facility] [address], [place], [place] [address] [phone]

## 2016-02-27 NOTE — Patient Instructions (Signed)
Thank you for coming to see [place] at [facility]. I hope we have been able to provide you high quality care today.  You may receive a patient satisfaction survey over the next few weeks. We would appreciate your feedback and comments so that we may continue to improve ourselves and the health of our patients.  - check BP at home - continue levetiracetam   ~~~~~~~~~~~~~~~~~~~~~~~~~~~~~~~~~~~~~~~~~~~~~~~~~~~~~~~~~~~~~~~~~  DR. Ferris Tally'S GUIDE TO HAPPY AND HEALTHY LIVING These are some of my general health and wellness recommendations. Some of them may apply to you better than others. Please use common sense as you try these suggestions and feel free to ask me any questions.   ACTIVITY/FITNESS Mental, social, emotional and physical stimulation are very important for brain and body health. Try learning a new activity (arts, music, language, sports, games).  Keep moving your body to the best of your abilities. You can do this at home, inside or outside, the park, community center, gym or anywhere you like. Consider a physical therapist or personal trainer to get started. Consider the app Sworkit. Fitness trackers such as smart-watches, smart-phones or Fitbits can help as well.   NUTRITION Eat more plants: colorful vegetables, nuts, seeds and berries.  Eat less sugar, salt, preservatives and processed foods.  Avoid toxins such as cigarettes and alcohol.  Drink water when you are thirsty. Warm water with a slice of lemon is an excellent morning drink to start the day.  Consider these websites for more information The Nutrition Source (https://www.henry-hernandez.biz/) Precision Nutrition (WindowBlog.ch)   RELAXATION Consider practicing mindfulness meditation or other relaxation techniques such as deep breathing, prayer, yoga, tai chi, massage. See website mindful.org or the apps Headspace or Calm to help get  started.   SLEEP Try to get at least 7-8+ hours sleep per day. Regular exercise and reduced caffeine will help you sleep better. Practice good sleep hygeine techniques. See website sleep.org for more information.   PLANNING Prepare estate planning, living will, healthcare POA documents. Sometimes this is best planned with the help of an attorney. Theconversationproject.org and agingwithdignity.org are excellent resources.

## 2016-11-10 ENCOUNTER — Ambulatory Visit: Payer: Self-pay

## 2017-02-25 ENCOUNTER — Ambulatory Visit (INDEPENDENT_AMBULATORY_CARE_PROVIDER_SITE_OTHER): Payer: Self-pay | Admitting: Diagnostic Neuroimaging

## 2017-02-25 ENCOUNTER — Encounter: Payer: Self-pay | Admitting: Diagnostic Neuroimaging

## 2017-02-25 ENCOUNTER — Encounter (INDEPENDENT_AMBULATORY_CARE_PROVIDER_SITE_OTHER): Payer: Self-pay

## 2017-02-25 VITALS — BP 156/94 | HR 69 | Wt 241.8 lb

## 2017-02-25 DIAGNOSIS — G40109 Localization-related (focal) (partial) symptomatic epilepsy and epileptic syndromes with simple partial seizures, not intractable, without status epilepticus: Secondary | ICD-10-CM

## 2017-02-25 DIAGNOSIS — E669 Obesity, unspecified: Secondary | ICD-10-CM

## 2017-02-25 DIAGNOSIS — IMO0001 Reserved for inherently not codable concepts without codable children: Secondary | ICD-10-CM

## 2017-02-25 DIAGNOSIS — Z6841 Body Mass Index (BMI) 40.0 and over, adult: Secondary | ICD-10-CM

## 2017-02-25 DIAGNOSIS — I10 Essential (primary) hypertension: Secondary | ICD-10-CM

## 2017-02-25 MED ORDER — LEVETIRACETAM 1000 MG PO TABS: 1000.0000 mg | ORAL_TABLET | Freq: Two times a day (BID) | ORAL | 4 refills | Status: DC

## 2017-02-25 NOTE — Progress Notes (Signed)
[facility]  PATIENT: Chelsea Walsh DOB: August 15, 1956  REFERRING CLINICIAN:  HISTORY FROM: patient  REASON FOR VISIT: follow up   HISTORICAL  CHIEF COMPLAINT:  Chief Complaint  Patient presents with  . Localization-related epilepsy    rm 6, "doing well"  . Follow-up    one year    HISTORY OF PRESENT ILLNESS:   UPDATE 02/25/17: Since last visit, doing well. No seizures. Tolerating LEV 1056m twice a day. Still has not seen primary care due to financial limitations and lack of insurance.   UPDATE 02/27/16: Since last visit, doing well. No seizures. Tolerating LEV 10053mBID. Occ anxiety stress issues, but manageble.   UPDATE 02/05/15: Since last visit, doing well. No sz. Tolerating LEV 150060mID. Still working and driving.  UPDATE 12/26/13: Patient returns for followup. She's doing well. No further seizures. Patient reports first seizure of life in 1993. Last seizure was in 2001. Patient has been on phenobarbital, Tegretol, Dilantin and Keppra in the past. Currently on Keppra 1500 mg twice a day. Typical seizures consist of olfactory are of ammonia smell followed by staring spell, confusion, sometimes generalized convulsions. She recalls unilateral convulsions at some point in her life, but does not recall what side of her body.  UPDATE 11/17/12: [name] returns today for followup. 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 She also has a history of headaches. She returns today  stating that she has had no seizure activity for 13 years. She has been followed at our office since 1993. 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 Rarely have they progressed to generalized seizures. EEG in 1994 an MRI of the brain were interpreted as normal. She has had multiple medication trials in the past including  Tegretol for which she developed a rash. She is currently on Dilantin and Keppra without difficulty or side effects. She says that she sometimes does not take her Dilantin because of insurance issues. Her Dexa scan last year shows osteopenia. She is currently taking added calcium and Vitamin D. She denies any confusion or lapses of time, any daytime drowsiness and feelings of being off balance. She does have a thyroid disorder and occasional swelling of the feet, she also has a cyst in her throat and has caused some  swallowing difficulties. No new neurological complaints.  REVIEW OF SYSTEMS: Full 14 system review of systems performed and negative except: snoring agitation anxiety ringing in ears.   ALLERGIES: Allergies  Allergen Reactions  . Tegretol [Carbamazepine] Hives    HOME MEDICATIONS: Outpatient Medications Prior to Visit  Medication Sig Dispense Refill  . calcium-vitamin D (OSCAL WITH D) 500-200 MG-UNIT per tablet Take 1 tablet by mouth daily.    . lMarland KitchenvETIRAcetam (KEPPRA) 1000 MG tablet Take 1 tablet (1,000 mg total) by mouth 2 (two) times daily. 180 tablet 4  . Multiple Vitamins-Minerals (MULTIVITAMIN WITH MINERALS) tablet Take 1 tablet by mouth daily.     No facility-administered medications prior to visit.     PAST MEDICAL HISTORY: Past Medical History:  Diagnosis Date  . Allergy   . Epilepsy ([facility]  last seizure 2000  . Migraine headache   . Osteopenia   . Seizures ([facility]  02/27/16 about 16 yrs since last sz    PAST SURGICAL HISTORY: History reviewed. No pertinent surgical history.  FAMILY HISTORY:  History reviewed. No pertinent family history.  SOCIAL HISTORY:  Social History   Social History  . Marital status: Single    Spouse name: Gwyndolyn Saxon  . Number of children: 3  . Years of education: College   Occupational History  . 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  Other    [employer]   Social History Main Topics  . Smoking status: Former Smoker     Packs/day: 0.70    Years: 4.00    Types: Cigarettes    Quit date: 02/03/2013  . Smokeless tobacco: Never Used  . Alcohol use No  . Drug use: No  . Sexual activity: Not on file   Other Topics Concern  . Not on file   Social History Narrative   Lives at home with husband   Drinks caffeine: 1-2 cups of coffee and 1 soda a day      PHYSICAL EXAM  Vitals:   02/25/17 0946  BP: (!) 156/94  Pulse: 69  Weight: 241 lb 12.8 oz (109.7 kg)    Not recorded      Body mass index is 41.5 kg/m.  GENERAL EXAM: Patient is in no distress; well developed, nourished and groomed; neck is supple  CARDIOVASCULAR: Regular rate and rhythm, no murmurs, no carotid bruits  NEUROLOGIC: MENTAL STATUS: awake, alert, language fluent, comprehension intact, naming intact, fund of knowledge appropriate CRANIAL NERVE: pupils equal and reactive to light, visual fields full to confrontation, extraocular muscles intact, no nystagmus, facial sensation and strength symmetric, hearing intact, palate elevates symmetrically, uvula midline, shoulder shrug symmetric, tongue midline. MOTOR: normal bulk and tone, full strength in the BUE, BLE SENSORY: normal and symmetric to light touch COORDINATION: finger-nose-finger, fine finger movements normal REFLEXES: deep tendon reflexes present and symmetric GAIT/STATION: narrow based gait; able to walk tandem; romberg is negative    DIAGNOSTIC DATA (LABS, IMAGING, TESTING) - I reviewed patient records, labs, notes, testing and imaging myself where available.  Lab Results  Component Value Date   WBC 9.4 07/06/2009   HGB 13.3 07/06/2009   HCT 39.0 07/06/2009   MCV 95.3 07/06/2009   PLT 253 07/06/2009      Component Value Date/Time   NA 138 07/06/2009 1940   K 3.9 07/06/2009 1940   CL 103 07/06/2009 1940   CO2 26 07/06/2009 1829   GLUCOSE 88 07/06/2009 1940   BUN 12 07/06/2009 1940   CREATININE 0.7 07/06/2009 1940   CALCIUM 8.7 07/06/2009 1829   PROT 6.4  07/06/2009 1829   ALBUMIN 3.8 07/06/2009 1829   AST 17 07/06/2009 1829   ALT 17 07/06/2009 1829   ALKPHOS 161 (H) 07/06/2009 1829   BILITOT 0.5 07/06/2009 1829   GFRNONAA >60 07/06/2009 1829   GFRAA  07/06/2009 1829    >60        The eGFR has been calculated using the MDRD equation. This calculation has not been validated in all clinical situations. eGFR's persistently <60 mL/min signify possible Chronic Kidney Disease.   No results found for: CHOL No results found for: HGBA1C No results found for: VITAMINB12 No results found for: TSH  01/17/93 MRI brain - normal   01/18/93 EEG - normal  03/07/94 EEG - normal  11/03/96 EEG - normal    ASSESSMENT AND PLAN  61 y.o. year old female here with history of complex partial seizures with secondary generalization since 1993. No seizures since 2001. Has done well on monotherapy with levetiracetam.   Dx:  Localization-related  epilepsy [facility])  Essential hypertension  Class 3 obesity with serious comorbidity and body mass index (BMI) of 40.0 to 44.9 in adult, unspecified obesity type ([place])    PLAN: I spent 15 minutes of face to face time with patient. Greater than 50% of time was spent in counseling and coordination of care with patient. In summary we discussed:  - Continue levetiracetam 1072m twice a day - I will refer patient to community health and wellness clinic --> now with increasing BP and increasing obesity  - Advised to monitor BP at home as well - Consider sleep study (snoring, obesity, HTN, fatigue)  Meds ordered this encounter  Medications  . levETIRAcetam (KEPPRA) 1000 MG tablet    Sig: Take 1 tablet (1,000 mg total) by mouth 2 (two) times daily.    Dispense:  180 tablet    Refill:  4   Orders Placed This Encounter  Procedures  . Ambulatory referral to Internal Medicine   Return in about 1 year (around 02/25/2018).   VPenni Bombard MD 50/86/7619 150:93AM Certified in Neurology, Neurophysiology and  Neuroimaging  [facility] [address] [place] [phone]

## 2018-02-23 ENCOUNTER — Ambulatory Visit: Payer: Self-pay | Admitting: Diagnostic Neuroimaging

## 2018-02-26 ENCOUNTER — Ambulatory Visit: Payer: Self-pay | Admitting: Diagnostic Neuroimaging

## 2018-03-18 ENCOUNTER — Telehealth: Payer: Self-pay | Admitting: Diagnostic Neuroimaging

## 2018-03-18 ENCOUNTER — Other Ambulatory Visit: Payer: Self-pay | Admitting: Diagnostic Neuroimaging

## 2018-03-18 MED ORDER — LEVETIRACETAM 1000 MG PO TABS: 1000.0000 mg | ORAL_TABLET | Freq: Two times a day (BID) | ORAL | 4 refills | Status: DC

## 2018-03-18 NOTE — Telephone Encounter (Signed)
Refilled Keppra.

## 2018-03-18 NOTE — Telephone Encounter (Signed)
Pt called stating she only has a weeks worth of levETIRAcetam (KEPPRA) 1000 MG tablet stating she will need enough to last her until upcoming appt.

## 2018-03-29 ENCOUNTER — Ambulatory Visit: Payer: Self-pay | Admitting: Diagnostic Neuroimaging

## 2018-08-17 ENCOUNTER — Ambulatory Visit: Payer: Self-pay | Admitting: Diagnostic Neuroimaging

## 2018-09-07 ENCOUNTER — Ambulatory Visit (INDEPENDENT_AMBULATORY_CARE_PROVIDER_SITE_OTHER): Payer: Self-pay | Admitting: Diagnostic Neuroimaging

## 2018-09-07 ENCOUNTER — Encounter: Payer: Self-pay | Admitting: Diagnostic Neuroimaging

## 2018-09-07 VITALS — BP 149/89 | HR 70 | Ht 64.5 in | Wt 253.4 lb

## 2018-09-07 DIAGNOSIS — G40109 Localization-related (focal) (partial) symptomatic epilepsy and epileptic syndromes with simple partial seizures, not intractable, without status epilepticus: Secondary | ICD-10-CM

## 2018-09-07 DIAGNOSIS — I10 Essential (primary) hypertension: Secondary | ICD-10-CM

## 2018-09-07 MED ORDER — LEVETIRACETAM 1000 MG PO TABS: 1000.0000 mg | ORAL_TABLET | Freq: Two times a day (BID) | ORAL | 4 refills | Status: DC

## 2018-09-07 NOTE — Patient Instructions (Addendum)
SEIZURE DISORDER - continue levetiracetam 1000mg  twice a day  HYPERTENSION (high blood pressure) - follow up with community health and wellness clinic --> BP and obesity - monitor BP at home - consider sleep study

## 2018-09-07 NOTE — Progress Notes (Signed)
[facility]  PATIENT: Chelsea Walsh DOB: 1955-11-13  REFERRING CLINICIAN:  HISTORY FROM: patient  REASON FOR VISIT: follow up   HISTORICAL  CHIEF COMPLAINT:  Chief Complaint  Patient presents with  . Epilepsy    rm 7, "no seizure activity"  . Follow-up    one year    HISTORY OF PRESENT ILLNESS:   UPDATE (09/07/18, VRP): Since last visit, doing well. No seizures. No alleviating or aggravating factors. Tolerating levetiracetam. Still uninsured. Still no PCP. No BP machine at home. Planning to get insurance in 2020 through work.     UPDATE 02/25/17: Since last visit, doing well. No seizures. Tolerating LEV 1071m twice a day. Still has not seen primary care due to financial limitations and lack of insurance.   UPDATE 02/27/16: Since last visit, doing well. No seizures. Tolerating LEV 10062mBID. Occ anxiety stress issues, but manageble.   UPDATE 02/05/15: Since last visit, doing well. No sz. Tolerating LEV 150084mID. Still working and driving.  UPDATE 12/26/13: Patient returns for followup. She's doing well. No further seizures. Patient reports first seizure of life in 1993. Last seizure was in 2001. Patient has been on phenobarbital, Tegretol, Dilantin and Keppra in the past. Currently on Keppra 1500 mg twice a day. Typical seizures consist of olfactory are of ammonia smell followed by staring spell, confusion, sometimes generalized convulsions. She recalls unilateral convulsions at some point in her life, but does not recall what side of her body.  UPDATE 11/17/12: [name] returns today for followup. 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 She also has a history of headaches. She returns today  stating that she has had no seizure activity for 13 years. She has been followed at our office since 1993. 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 Rarely have they progressed to generalized seizures. EEG in 1994 an MRI of the brain were interpreted as normal. She has had multiple medication trials in the past including Tegretol for which she developed a rash. She is currently on Dilantin and Keppra without difficulty or side effects. She says that she sometimes does not take her Dilantin because of insurance issues. Her Dexa scan last year shows osteopenia. She is currently taking added calcium and Vitamin D. She denies any confusion or lapses of time, any daytime drowsiness and feelings of being off balance. She does have a thyroid disorder and occasional swelling of the feet, she also has a cyst in her throat and has caused some  swallowing difficulties. No new neurological complaints.  REVIEW OF SYSTEMS: Full 14 system review of systems performed and negative except: negative.   ALLERGIES: Allergies  Allergen Reactions  . Tegretol [Carbamazepine] Hives    HOME MEDICATIONS: Outpatient Medications Prior to Visit  Medication Sig Dispense Refill  . calcium-vitamin D (OSCAL WITH D) 500-200 MG-UNIT per tablet Take 1 tablet by mouth daily.    . lMarland KitchenvETIRAcetam (KEPPRA) 1000 MG tablet Take 1 tablet (1,000 mg total) by mouth 2 (two) times daily. 180 tablet 4  . Multiple Vitamins-Minerals (MULTIVITAMIN WITH MINERALS) tablet Take 1 tablet by mouth daily.     No facility-administered medications prior to visit.     PAST MEDICAL HISTORY: Past Medical History:  Diagnosis Date  . Allergy   . [place]  [place]  . Migraine headache   .  Osteopenia   . Seizures ([place])    02/27/16 about 16 yrs since last sz    PAST SURGICAL HISTORY: No past surgical history on file.  FAMILY HISTORY: No family history on file.  SOCIAL HISTORY:  Social History   Socioeconomic History  . Marital status: Single    Spouse name: Chelsea Walsh  . Number of children: 3  . Years of education:  College  . Highest education level: Not on file  Occupational History  . Occupation: Theatre manager: OTHER    Comment: Holiday representative: OTHER    Comment: [employer]  Social Needs  . Financial resource strain: Not on file  . Food insecurity:    Worry: Not on file    Inability: Not on file  . Transportation needs:    Medical: Not on file    Non-medical: Not on file  Tobacco Use  . Smoking status: Former Smoker    Packs/day: 0.70    Years: 4.00    Pack years: 2.80    Types: Cigarettes    Last attempt to quit: 02/03/2013    Years since quitting: 5.5  . Smokeless tobacco: Never Used  Substance and Sexual Activity  . Alcohol use: No    Alcohol/week: 0.0 standard drinks  . Drug use: No  . Sexual activity: Not on file  Lifestyle  . Physical activity:    Days per week: Not on file    Minutes per session: Not on file  . Stress: Not on file  Relationships  . Social connections:    Talks on phone: Not on file    Gets together: Not on file    Attends religious service: Not on file    Active member of club or organization: Not on file    Attends meetings of clubs or organizations: Not on file    Relationship status: Not on file  . Intimate partner violence:    Fear of current or ex partner: Not on file    Emotionally abused: Not on file    Physically abused: Not on file    Forced sexual activity: Not on file  Other Topics Concern  . Not on file  Social History Narrative   Lives at home with husband   Drinks caffeine: 1-2 cups of coffee and 1 soda a day      PHYSICAL EXAM  GENERAL EXAM/CONSTITUTIONAL: Vitals:  Vitals:   09/07/18 0805  BP: (!) 149/89  Pulse: 70  Weight: 253 lb 6.4 oz (114.9 kg)  Height: 5' 4.5" (1.638 m)     Body mass index is 42.82 kg/m. Wt Readings from Last 3 Encounters:  09/07/18 253 lb 6.4 oz (114.9 kg)  02/25/17 241 lb 12.8 oz (109.7 kg)  02/27/16 247 lb 12.8 oz (112.4 kg)     Patient is in no distress; well  developed, nourished and groomed; neck is supple  CARDIOVASCULAR:  Examination of carotid arteries is normal; no carotid bruits  Regular rate and rhythm, no murmurs  Examination of peripheral vascular system by observation and palpation is normal  EYES:  Ophthalmoscopic exam of optic discs and posterior segments is normal; no papilledema or hemorrhages  No exam data present  MUSCULOSKELETAL:  Gait, strength, tone, movements noted in Neurologic exam below  NEUROLOGIC: MENTAL STATUS:  No flowsheet data found.  awake, alert, oriented to person, place and time  recent and remote memory intact  normal attention and concentration  language fluent, comprehension intact, naming  intact  fund of knowledge appropriate  CRANIAL NERVE:   2nd - no papilledema on fundoscopic exam  2nd, 3rd, 4th, 6th - pupils equal and reactive to light, visual fields full to confrontation, extraocular muscles intact, no nystagmus  5th - facial sensation symmetric  7th - facial strength symmetric  8th - hearing intact  9th - palate elevates symmetrically, uvula midline  11th - shoulder shrug symmetric  12th - tongue protrusion midline  MOTOR:   normal bulk and tone, full strength in the BUE, BLE  SENSORY:   normal and symmetric to light touch, temperature, vibration  COORDINATION:   finger-nose-finger, fine finger movements normal  REFLEXES:   deep tendon reflexes TRACE and symmetric  GAIT/STATION:   narrow based gait; able to walk tandem      DIAGNOSTIC DATA (LABS, IMAGING, TESTING) - I reviewed patient records, labs, notes, testing and imaging myself where available.  Lab Results  Component Value Date   WBC 9.4 07/06/2009   HGB 13.3 07/06/2009   HCT 39.0 07/06/2009   MCV 95.3 07/06/2009   PLT 253 07/06/2009      Component Value Date/Time   NA 138 07/06/2009 1940   K 3.9 07/06/2009 1940   CL 103 07/06/2009 1940   CO2 26 07/06/2009 1829   GLUCOSE 88  07/06/2009 1940   BUN 12 07/06/2009 1940   CREATININE 0.7 07/06/2009 1940   CALCIUM 8.7 07/06/2009 1829   PROT 6.4 07/06/2009 1829   ALBUMIN 3.8 07/06/2009 1829   AST 17 07/06/2009 1829   ALT 17 07/06/2009 1829   ALKPHOS 161 (H) 07/06/2009 1829   BILITOT 0.5 07/06/2009 1829   GFRNONAA >60 07/06/2009 1829   GFRAA  07/06/2009 1829    >60        The eGFR has been calculated using the MDRD equation. This calculation has not been validated in all clinical situations. eGFR's persistently <60 mL/min signify possible Chronic Kidney Disease.   No results found for: CHOL No results found for: HGBA1C No results found for: VITAMINB12 No results found for: TSH  01/17/93 MRI brain - normal   01/18/93 EEG - normal  03/07/94 EEG - normal  11/03/96 EEG - normal    ASSESSMENT AND PLAN  62 y.o. year old female here with history of complex partial seizures with secondary generalization since 1993. No seizures since 2001. Has done well on monotherapy with levetiracetam.   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 levETIRAcetam (KEPPRA) 1000 MG tablet    Sig: Take 1 tablet (1,000 mg total) by mouth 2 (two) times daily.    Dispense:  180 tablet    Refill:  4   Return in about 10 months (around 07/09/2019).   VPenni Bombard MD 106/12/158 81:09AM Certified in Neurology, Neurophysiology and Neuroimaging  [facility] [address] [place] [phone]

## 2019-07-19 ENCOUNTER — Ambulatory Visit: Payer: Self-pay | Admitting: Diagnostic Neuroimaging

## 2019-08-16 ENCOUNTER — Other Ambulatory Visit: Payer: Self-pay

## 2019-08-16 DIAGNOSIS — Z20822 Contact with and (suspected) exposure to covid-19: Secondary | ICD-10-CM

## 2019-08-18 LAB — NOVEL CORONAVIRUS, NAA: SARS-CoV-2, NAA: NOT DETECTED

## 2019-08-31 ENCOUNTER — Ambulatory Visit: Payer: Self-pay | Admitting: Diagnostic Neuroimaging

## 2019-09-27 ENCOUNTER — Ambulatory Visit: Payer: Self-pay | Admitting: Diagnostic Neuroimaging

## 2019-09-28 ENCOUNTER — Other Ambulatory Visit: Payer: Self-pay | Admitting: Diagnostic Neuroimaging

## 2019-10-11 ENCOUNTER — Encounter: Payer: Self-pay | Admitting: Diagnostic Neuroimaging

## 2019-10-11 ENCOUNTER — Other Ambulatory Visit: Payer: Self-pay

## 2019-10-11 ENCOUNTER — Telehealth (INDEPENDENT_AMBULATORY_CARE_PROVIDER_SITE_OTHER): Payer: Self-pay | Admitting: Diagnostic Neuroimaging

## 2019-10-11 DIAGNOSIS — G40109 Localization-related (focal) (partial) symptomatic epilepsy and epileptic syndromes with simple partial seizures, not intractable, without status epilepticus: Secondary | ICD-10-CM

## 2019-10-11 DIAGNOSIS — I10 Essential (primary) hypertension: Secondary | ICD-10-CM

## 2019-10-11 MED ORDER — LEVETIRACETAM 1000 MG PO TABS: 1000.0000 mg | ORAL_TABLET | Freq: Two times a day (BID) | ORAL | 4 refills | Status: DC

## 2019-10-11 NOTE — Progress Notes (Signed)
Virtual Visit via Video Note  I connected with Chelsea Walsh on 10/11/19 at  9:00 AM EST by a video enabled telemedicine application and verified that I am speaking with the correct person using two identifiers.   I discussed the limitations of evaluation and management by telemedicine and the availability of in person appointments. The patient expressed understanding and agreed to proceed.  Patient is at their home. I am at the office.    History of Present Illness:  UPDATE (10/11/19, VRP):  - doing well; no seizures; tolerating levetiracetam 1000mg  twice a day - still without insurance; has not been able to establish with PCP  UPDATE (09/07/18, VRP): Since last visit, doing well. No seizures. No alleviating or aggravating factors. Tolerating levetiracetam. Still uninsured. Still no PCP. No BP machine at home. Planning to get insurance in 2020 through work.    UPDATE 02/25/17: Since last visit, doing well. No seizures. Tolerating LEV 1000mg  twice a day. Still has not seen primary care due to financial limitations and lack of insurance.   UPDATE 02/27/16: Since last visit, doing well. No seizures. Tolerating LEV 1000mg  BID. Occ anxiety stress issues, but manageble.   UPDATE 02/05/15: Since last visit, doing well. No sz. Tolerating LEV 1500mg  BID. Still working and driving.  UPDATE 12/26/13: Patient returns for followup. She's doing well. No further seizures. Patient reports first seizure of life in 1993. Last seizure was in 2001. Patient has been on phenobarbital, Tegretol, Dilantin and Keppra in the past. Currently on Keppra 1500 mg twice a day. Typical seizures consist of olfactory are of ammonia smell followed by staring spell, confusion, sometimes generalized convulsions. She recalls unilateral convulsions at some point in her life, but does not recall what side of her body.  UPDATE 11/17/12: [name], 64 year old white female returns today for followup. She was last seen 08/08/11.She  has a history of seizure disorder  felt to be complex partial. She also has a history of headaches. She returns today  stating that she has had no seizure activity for 13 years. She has been followed at our office since 1993. 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 Rarely have they progressed to generalized seizures. EEG in 1994 an MRI of the brain were interpreted as normal. She has had multiple medication trials in the past including Tegretol for which she developed a rash. She is currently on Dilantin and Keppra without difficulty or side effects. She says that she sometimes does not take her Dilantin because of insurance issues. Her Dexa scan last year shows osteopenia. She is currently taking added calcium and Vitamin D. She denies any confusion or lapses of time, any daytime drowsiness and feelings of being off balance. She does have a thyroid disorder and occasional swelling of the feet, she also has a cyst in her throat and has caused some  swallowing difficulties. No new neurological complaints.    Observations/Objective:  - awake, alert - lang fluent; comp intact - face symm; no dysarthria   Assessment and Plan:  Dx:  1. 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 Essential hypertension      SEIZURE DISORDER - continue levetiracetam 1000mg  twice a day  HYPERTENSION - follow up with community health and wellness clinic --> BP and obesity - monitor BP at home as well - consider sleep study (snoring, obesity, HTN, fatigue)   Follow Up Instructions:  - Return in about 1  year (around 10/10/2020) for with NP (Amy Lomax).    I discussed the assessment and treatment plan with the patient. The patient was provided an opportunity to ask questions and all were answered. The patient agreed with the plan and demonstrated an understanding of the instructions.   The patient was advised to  call back or seek an in-person evaluation if the symptoms worsen or if the condition fails to improve as anticipated.  I provided 15 minutes of non-face-to-face time during this encounter.   Chelsea Marker, MD 10/11/2019, 9:23 AM Certified in Neurology, Neurophysiology and Neuroimaging  [facility] [address] [place], [place] [address] [phone]

## 2019-12-23 ENCOUNTER — Ambulatory Visit: Payer: Self-pay

## 2020-01-16 ENCOUNTER — Ambulatory Visit: Payer: Self-pay

## 2020-09-13 ENCOUNTER — Other Ambulatory Visit: Payer: Self-pay | Admitting: Diagnostic Neuroimaging

## 2020-10-13 ENCOUNTER — Ambulatory Visit: Payer: Self-pay | Attending: Internal Medicine

## 2020-10-13 DIAGNOSIS — Z23 Encounter for immunization: Secondary | ICD-10-CM

## 2020-10-13 NOTE — Progress Notes (Signed)
   Covid-19 Vaccination Clinic  Name:  Chelsea Walsh    MRN: [id number] DOB: March 15, 1956  10/13/2020  Ms. Chelsea Walsh was observed post Covid-19 immunization for 15 minutes without incident. She was provided with Vaccine Information Sheet and instruction to access the V-Safe system.   Ms. Chelsea Walsh was instructed to call 911 with any severe reactions post vaccine: Marland Kitchen Difficulty breathing  . Swelling of face and throat  . A fast heartbeat  . A bad rash all over body  . Dizziness and weakness   Immunizations Administered    Name Date Dose VIS Date Route   Pfizer COVID-19 Vaccine 10/13/2020  9:55 AM 0.3 mL 07/25/2020 Intramuscular   Manufacturer: [employer], [employer]   Lot: [id number]   NDC: [address]-1

## 2020-11-16 ENCOUNTER — Encounter: Payer: Self-pay | Admitting: Family Medicine

## 2020-11-21 NOTE — Progress Notes (Signed)
PATIENT: Chelsea Walsh DOB: 07-18-1956  REASON FOR VISIT: follow up HISTORY FROM: patient  Virtual Visit via Telephone Note  I connected with Chelsea Walsh on 11/22/20 at  7:45 AM EST by telephone and verified that I am speaking with the correct person using two identifiers.   I discussed the limitations, risks, security and privacy concerns of performing an evaluation and management service by telephone and the availability of in person appointments. I also discussed with the patient that there may be a patient responsible charge related to this service. The patient expressed understanding and agreed to proceed.   History of Present Illness:  11/22/20 ALL:  Chelsea Walsh is a 65 y.o. female here today for follow up. She continues levetiracetam 1000mg  BID. No recent seizure activity. She is feeling well and without complaints. She has not established care with PCP due to pandemic.    History (copied from Dr note)   UPDATE (10/11/19, VRP):  - doing well; no seizures; tolerating levetiracetam 1000mg  twice a day - still without insurance; has not been able to establish with PCP  UPDATE (09/07/18, VRP): Since last visit, doingwell.No seizures.No alleviating or aggravating factors. Toleratinglevetiracetam. Still uninsured. Still no PCP. No BP machine at home. Planning to get insurance in 2020 through work.  UPDATE 02/25/17: Since last visit, doing well. No seizures. Tolerating LEV 1000mg  twice a day. Still has not seen primary care due to financial limitations and lack of insurance.   UPDATE 02/27/16: Since last visit, doing well. No seizures. Tolerating LEV 1000mg  BID. Occ anxiety stress issues, but manageble.   UPDATE 02/05/15: Since last visit, doing well. No sz. Tolerating LEV 1500mg  BID. Still working and driving.  UPDATE 12/26/13: Patient returns for followup. She's doing well. No further seizures. Patient reports first seizure of life in 1993. Last seizure was in 2001.  Patient has been on phenobarbital, Tegretol, Dilantin and Keppra in the past. Currently on Keppra 1500 mg twice a day. Typical seizures consist of olfactory are of ammonia smell followed by staring spell, confusion, sometimes generalized convulsions. She recalls unilateral convulsions at some point in her life, but does not recall what side of her body.  UPDATE 11/17/12: Ms. , 65 year old white female returns today for followup. She was last seen 08/08/11.She has a history of seizure disorder felt to be complex partial. She also has a history of headaches. She returns today stating that she has had no seizure activity for 13 years. She has been followed at our office since 1993. 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 Rarely have they progressed to generalized seizures. EEG in 1994 an MRI of the brain were interpreted as normal. She has had multiple medication trials in the past including Tegretol for which she developed a rash. She is currently on Dilantin and Keppra without difficulty or side effects. She says that she sometimes does not take her Dilantin because of insurance issues. Her Dexa scan last year shows osteopenia. She is currently taking added calcium and Vitamin D. She denies any confusion or lapses of time, any daytime drowsiness and feelings of being off balance. She does have a thyroid disorder and occasional swelling of the feet, she also has a cyst in her throat and has caused some swallowing difficulties. No new neurological complaints.   Observations/Objective:  Generalized: Well developed, in no acute distress  Mentation: Alert oriented to time, place, history taking. Follows all commands  speech and language fluent   Assessment and Plan:  65 y.o. year old female  has a past medical history of Allergy, Epilepsy (HCC), Migraine headache, Osteopenia, and Seizures (HCC). here  with    ICD-10-CM   1. Localization-related epilepsy [facility])  G40.109      Chelsea Walsh is doing well, today. She continues to tolerate levetiracetam 1000mg  BID. No seizures. Refills UTD. She was encouraged to establish care with PCP for annual CPE and blood work. Healthy lifestyle habits encouraged. She will follow up in 1 year, sooner if needed.    No orders of the defined types were placed in this encounter.   No orders of the defined types were placed in this encounter.    Follow Up Instructions:  I discussed the assessment and treatment plan with the patient. The patient was provided an opportunity to ask questions and all were answered. The patient agreed with the plan and demonstrated an understanding of the instructions.   The patient was advised to call back or seek an in-person evaluation if the symptoms worsen or if the condition fails to improve as anticipated.  I provided 15 minutes of non-face-to-face time during this encounter. Patient is passenger in car during Mychart visit, provider is in the office.    , NP

## 2020-11-22 ENCOUNTER — Encounter: Payer: Self-pay | Admitting: Family Medicine

## 2020-11-22 ENCOUNTER — Telehealth (INDEPENDENT_AMBULATORY_CARE_PROVIDER_SITE_OTHER): Payer: Self-pay | Admitting: Family Medicine

## 2020-11-22 ENCOUNTER — Ambulatory Visit: Payer: Self-pay | Admitting: Family Medicine

## 2020-11-22 DIAGNOSIS — G40109 Localization-related (focal) (partial) symptomatic epilepsy and epileptic syndromes with simple partial seizures, not intractable, without status epilepticus: Secondary | ICD-10-CM

## 2020-11-22 NOTE — Progress Notes (Signed)
I reviewed note and agree with plan.   Suanne Marker, MD 11/22/2020, 1:49 PM Certified in Neurology, Neurophysiology and Neuroimaging  [facility] [address], [address] [place], [place] [address] [phone]

## 2020-12-07 ENCOUNTER — Encounter: Payer: Self-pay | Admitting: Family Medicine

## 2020-12-11 ENCOUNTER — Telehealth: Payer: Self-pay | Admitting: Family Medicine

## 2021-09-14 ENCOUNTER — Other Ambulatory Visit: Payer: Self-pay | Admitting: Diagnostic Neuroimaging

## 2021-12-09 NOTE — Patient Instructions (Signed)
Below is our plan: ? ?We will continue levetiracetam 1000mg twice daily.  ? ?Please make sure you are consistent with timing of seizure medication. I recommend annual visit with primary care provider (PCP) for complete physical and routine blood work. We will monitor vitamin D level. I recommend daily intake of vitamin D (400-800iu) and calcium (800-1000mg) for bone health. Discuss Dexa screening with PCP.  ? ?According to [place] law, you can not drive unless you are seizure / syncope free for at least 6 months and under physician's care. ? ?Please maintain precautions. Do not participate in activities where a loss of awareness could harm you or someone else. No swimming alone, no tub bathing, no hot tubs, no driving, no operating motorized vehicles (cars, ATVs, motocycles, etc), lawnmowers, power tools or firearms. No standing at heights, such as rooftops, ladders or stairs. Avoid hot objects such as stoves, heaters, open fires. Wear a helmet when riding a bicycle, scooter, skateboard, etc. and avoid areas of traffic. Set your water heater to 120 degrees or less.  ? ? ?Please make sure you are staying well hydrated. I recommend 50-60 ounces daily. Well balanced diet and regular exercise encouraged. Consistent sleep schedule with 6-8 hours recommended.  ? ?Please continue follow up with care team as directed.  ? ?Follow up with me in 1 year  ? ?You may receive a survey regarding today's visit. I encourage you to leave honest feed back as I do use this information to improve patient care. Thank you for seeing me today!  ? ? ?

## 2021-12-09 NOTE — Progress Notes (Signed)
? ?PATIENT: Chelsea Walsh ?DOB: 20-May-1956 ? ?REASON FOR VISIT: follow up ?HISTORY FROM: patient ? ?Virtual Visit via Telephone Note ? ?I connected with Chelsea Walsh on 12/10/21 at  7:45 AM EST by telephone and verified that I am speaking with the correct person using two identifiers. ?  ?I discussed the limitations, risks, security and privacy concerns of performing an evaluation and management service by telephone and the availability of in person appointments. I also discussed with the patient that there may be a patient responsible charge related to this service. The patient expressed understanding and agreed to proceed. ? ? ?History of Present Illness: ? ?12/10/21 ALL (Mychart):  ?Chelsea Walsh returns for follow up for seizures. She continues to do well on lev 1000mg  BID. She is tolerating meds well with no obvious adverse effects. No seizure activity. Last seizure 2001. She is uninsured.  ? ?11/22/2020 ALL (Mychart): ?Chelsea Walsh is a 66 y.o. female here today for follow up. She continues levetiracetam 1000mg  BID. No recent seizure activity. She is feeling well and without complaints. She has not established care with PCP due to pandemic.  ? ?History (copied from Dr 76 note)  ? ?UPDATE (10/11/19, VRP):  ?- doing well; no seizures; tolerating levetiracetam 1000mg  twice a day ?- still without insurance; has not been able to establish with PCP ?  ?UPDATE (09/07/18, VRP): Since last visit, doing well. No seizures. No alleviating or aggravating factors. Tolerating levetiracetam. Still uninsured. Still no PCP. No BP machine at home. Planning to get insurance in 2020 through work.   ?  ?UPDATE 02/25/17: Since last visit, doing well. No seizures. Tolerating LEV 1000mg  twice a day. Still has not seen primary care due to financial limitations and lack of insurance.  ?  ?UPDATE 02/27/16: Since last visit, doing well. No seizures. Tolerating LEV 1000mg  BID. Occ anxiety stress issues, but manageble.  ?  ?UPDATE 02/05/15: Since last  visit, doing well. No sz. Tolerating LEV 1500mg  BID. Still working and driving. ?  ?UPDATE 12/26/13: Patient returns for followup. She's doing well. No further seizures. Patient reports first seizure of life in 1993. Last seizure was in 2001. Patient has been on phenobarbital, Tegretol, Dilantin and Keppra in the past. Currently on Keppra 1500 mg twice a day. Typical seizures consist of olfactory are of ammonia smell followed by staring spell, confusion, sometimes generalized convulsions. She recalls unilateral convulsions at some point in her life, but does not recall what side of her body. ?  ?UPDATE 11/17/12: Ms. 04/07/15, 66 year old white female returns today for followup. 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 She also has a history of headaches. She returns today  stating that she has had no seizure activity for 13 years. She has been followed at our office since 1993. 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 Rarely have they progressed to generalized seizures. EEG in 1994 an MRI of the brain were interpreted as normal. She has had multiple medication trials in the past including Tegretol for which she developed a rash. She is currently on Dilantin and Keppra without difficulty or side effects. She says that she sometimes does not take her Dilantin because of insurance issues. Her Dexa scan last year shows osteopenia. She is currently taking added calcium and Vitamin D. She denies any confusion or lapses of time, any daytime drowsiness and feelings of being off  balance. She does have a thyroid disorder and occasional swelling of the feet, she also has a cyst in her throat and has caused some  swallowing difficulties. No new neurological complaints. ?  ? ?Observations/Objective: ? ?Generalized: Well developed, in no acute distress  ?Mentation: Alert oriented to  time, place, history taking. Follows all commands speech and language fluent ? ? ?Assessment and Plan: ? ?66 y.o. year old female  has a past medical history of Allergy, Epilepsy (HCC), Migraine headache, Osteopenia, and Seizures (HCC). here with ? ?  ICD-10-CM   ?1. 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  ? ?  ? ?Chelsea Walsh is doing well, today. She continues to tolerate levetiracetam 1000mg  BID. No seizures. Refills UTD. She was encouraged to establish care with PCP for annual CPE and blood work. Healthy lifestyle habits encouraged. She will follow up in 1 year, sooner if needed.  ? ? ?No orders of the defined types were placed in this encounter. ? ? ?No orders of the defined types were placed in this encounter. ? ? ? ?Follow Up Instructions: ? ?I discussed the assessment and treatment plan with the patient. The patient was provided an opportunity to ask questions and all were answered. The patient agreed with the plan and demonstrated an understanding of the instructions. ?  ?The patient was advised to call back or seek an in-person evaluation if the symptoms worsen or if the condition fails to improve as anticipated. ? ?I provided 15 minutes of non-face-to-face time during this encounter. Patient is passenger in car during Mychart visit, provider is in the office.  ? ? ? , NP  ? ? ?

## 2021-12-10 ENCOUNTER — Telehealth (INDEPENDENT_AMBULATORY_CARE_PROVIDER_SITE_OTHER): Payer: Self-pay | Admitting: Family Medicine

## 2021-12-10 ENCOUNTER — Encounter: Payer: Self-pay | Admitting: Family Medicine

## 2021-12-10 DIAGNOSIS — G40109 Localization-related (focal) (partial) symptomatic epilepsy and epileptic syndromes with simple partial seizures, not intractable, without status epilepticus: Secondary | ICD-10-CM

## 2021-12-10 MED ORDER — LEVETIRACETAM 1000 MG PO TABS: 1000.0000 mg | ORAL_TABLET | Freq: Two times a day (BID) | ORAL | 3 refills | Status: DC

## 2022-11-25 ENCOUNTER — Other Ambulatory Visit: Payer: Self-pay | Admitting: *Deleted

## 2022-11-25 MED ORDER — LEVETIRACETAM 1000 MG PO TABS: 1000.0000 mg | ORAL_TABLET | Freq: Two times a day (BID) | ORAL | 0 refills | Status: DC

## 2022-12-17 ENCOUNTER — Other Ambulatory Visit: Payer: Self-pay

## 2022-12-17 DIAGNOSIS — G40109 Localization-related (focal) (partial) symptomatic epilepsy and epileptic syndromes with simple partial seizures, not intractable, without status epilepticus: Secondary | ICD-10-CM

## 2022-12-17 MED ORDER — LEVETIRACETAM 1000 MG PO TABS: 1000.0000 mg | ORAL_TABLET | Freq: Two times a day (BID) | ORAL | 2 refills | Status: DC

## 2023-02-05 NOTE — Progress Notes (Signed)
Chief Complaint  Patient presents with   Follow-up    Pt in room 1, here for epilepsy follow up. Pt reports doing well. No recent seizures. Pt blood pressure is elevated x 2.    HISTORY OF PRESENT ILLNESS:  02/09/23 ALL:  Chelsea Walsh is a 67 y.o. female here today for follow up for seizures. She continues levetiracetam 1000mg  twice daily. She denies seizure activity since 2001. She is tolerating med. She is recently insured with medicare. She is planing to schedule visit with her husband's PCP. She checks BP at home and reports readings are always good. She is planning to have some dental work done. She continues to work full time. She drives without difficulty.   12/10/21 ALL (Mychart):  Chelsea Walsh returns for follow up for seizures. She continues to do well on lev 1000mg  BID. She is tolerating meds well with no obvious adverse effects. No seizure activity. Last seizure 2001. She is uninsured.    11/22/2020 ALL (Mychart): Chelsea Walsh is a 67 y.o. female here today for follow up. She continues levetiracetam 1000mg  BID. No recent seizure activity. She is feeling well and without complaints. She has not established care with PCP due to pandemic.    History (copied from Dr Richrd Humbles note)    UPDATE (10/11/19, VRP):  - doing well; no seizures; tolerating levetiracetam 1000mg  twice a day - still without insurance; has not been able to establish with PCP   UPDATE (09/07/18, VRP): Since last visit, doing well. No seizures. No alleviating or aggravating factors. Tolerating levetiracetam. Still uninsured. Still no PCP. No BP machine at home. Planning to get insurance in 2020 through work.     UPDATE 02/25/17: Since last visit, doing well. No seizures. Tolerating LEV 1000mg  twice a day. Still has not seen primary care due to financial limitations and lack of insurance.    UPDATE 02/27/16: Since last visit, doing well. No seizures. Tolerating LEV 1000mg  BID. Occ anxiety stress issues, but manageble.     UPDATE 02/05/15: Since last visit, doing well. No sz. Tolerating LEV 1500mg  BID. Still working and driving.   UPDATE 12/26/13: Patient returns for followup. She's doing well. No further seizures. Patient reports first seizure of life in 1993. Last seizure was in 2001. Patient has been on phenobarbital, Tegretol, Dilantin and Keppra in the past. Currently on Keppra 1500 mg twice a day. Typical seizures consist of olfactory are of ammonia smell followed by staring spell, confusion, sometimes generalized convulsions. She recalls unilateral convulsions at some point in her life, but does not recall what side of her body.   UPDATE 11/17/12: Chelsea Walsh, 67 year old white female returns today for followup. 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 She also has a history of headaches. She returns today  stating that she has had no seizure activity for 13 years. She has been followed at our office since 1993. 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 Rarely have they progressed to generalized seizures. EEG in 1994 an MRI of the brain were interpreted as normal. She has had multiple medication trials in the past including Tegretol for which she developed a rash. She is currently on Dilantin and Keppra without difficulty or side effects. She says that she sometimes does not take her Dilantin because of insurance issues. Her Dexa scan last year shows osteopenia. She is currently taking added  calcium and Vitamin D. She denies any confusion or lapses of time, any daytime drowsiness and feelings of being off balance. She does have a thyroid disorder and occasional swelling of the feet, she also has a cyst in her throat and has caused some  swallowing difficulties. No new neurological complaints.   REVIEW OF SYSTEMS: Out of a complete 14 system review of symptoms, the patient  complains only of the following symptoms, none and all other reviewed systems are negative.   ALLERGIES: Allergies  Allergen Reactions   Tegretol [Carbamazepine] Hives     HOME MEDICATIONS: Outpatient Medications Prior to Visit  Medication Sig Dispense Refill   calcium-vitamin D (OSCAL WITH D) 500-200 MG-UNIT per tablet Take 1 tablet by mouth daily.     Multiple Vitamins-Minerals (MULTIVITAMIN WITH MINERALS) tablet Take 1 tablet by mouth daily.     levETIRAcetam (KEPPRA) 1000 MG tablet Take 1 tablet (1,000 mg total) by mouth 2 (two) times daily. 180 tablet 2   No facility-administered medications prior to visit.     PAST MEDICAL HISTORY: Past Medical History:  Diagnosis Date   Allergy    Epilepsy (HCC)    last seizure 2000   Migraine headache    Osteopenia    Seizures (HCC)    02/27/16 about 16 yrs since last sz     PAST SURGICAL HISTORY: History reviewed. No pertinent surgical history.   FAMILY HISTORY: History reviewed. No pertinent family history.   SOCIAL HISTORY: Social History   Socioeconomic History   Marital status: Married    Spouse name: Chrissie Noa   Number of children: 3   Years of education: [employer] education level: Not on file  Occupational History   Occupation: Conservation officer, nature: OTHER    Comment: collect and Copywriter, advertising: OTHER    Comment: [employer]  Tobacco Use   Smoking status: Former    Packs/day: 0.70    Years: 4.00    Additional pack years: 0.00    Total pack years: 2.80    Types: Cigarettes    Quit date: 02/03/2013    Years since quitting: 10.0   Smokeless tobacco: Never  Substance and Sexual Activity   Alcohol use: No    Alcohol/week: 0.0 standard drinks of alcohol   Drug use: No   Sexual activity: Not on file  Other Topics Concern   Not on file  Social History Narrative   Lives at home with husband   Drinks caffeine: 1-2 cups of coffee and 1 soda a day    Social Determinants of Health   Financial Resource  Strain: Not on file  Food Insecurity: Not on file  Transportation Needs: Not on file  Physical Activity: Not on file  Stress: Not on file  Social Connections: Not on file  Intimate Partner Violence: Not on file     PHYSICAL EXAM  Vitals:   02/09/23 1303 02/09/23 1307  BP: (!) 149/89 (!) 148/97  Pulse: 71 70  Weight: 230 lb 8 oz (104.6 kg)   Height: 5' 4.5" (1.638 m)    Body mass index is 38.95 kg/m.  Generalized: Well developed, in no acute distress  Cardiology: normal rate and rhythm, no murmur auscultated  Respiratory: clear to auscultation bilaterally    Neurological examination  Mentation: Alert oriented to time, place, history taking. Follows all commands speech and language fluent Cranial nerve II-XII: Pupils were equal round reactive to light. Extraocular movements were full, visual field were  full on confrontational test. Facial sensation and strength were normal. Uvula tongue midline. Head turning and shoulder shrug  were normal and symmetric. Motor: The motor testing reveals 5 over 5 strength of all 4 extremities. Good symmetric motor tone is noted throughout.  Sensory: Sensory testing is intact to soft touch on all 4 extremities. No evidence of extinction is noted.  Coordination: Cerebellar testing reveals good finger-nose-finger and heel-to-shin bilaterally.  Gait and station: Gait is normal. Tandem gait is normal. Romberg is negative. No drift is seen.  Reflexes: Deep tendon reflexes are symmetric and normal bilaterally.    DIAGNOSTIC DATA (LABS, IMAGING, TESTING) - I reviewed patient records, labs, notes, testing and imaging myself where available.  Lab Results  Component Value Date   WBC 9.4 07/06/2009   HGB 13.3 07/06/2009   HCT 39.0 07/06/2009   MCV 95.3 07/06/2009   PLT 253 07/06/2009      Component Value Date/Time   NA 138 07/06/2009 1940   K 3.9 07/06/2009 1940   CL 103 07/06/2009 1940   CO2 26 07/06/2009 1829   GLUCOSE 88 07/06/2009 1940    BUN 12 07/06/2009 1940   CREATININE 0.7 07/06/2009 1940   CALCIUM 8.7 07/06/2009 1829   PROT 6.4 07/06/2009 1829   ALBUMIN 3.8 07/06/2009 1829   AST 17 07/06/2009 1829   ALT 17 07/06/2009 1829   ALKPHOS 161 (H) 07/06/2009 1829   BILITOT 0.5 07/06/2009 1829   GFRNONAA >60 07/06/2009 1829   GFRAA  07/06/2009 1829    >60        The eGFR has been calculated using the MDRD equation. This calculation has not been validated in all clinical situations. eGFR's persistently <60 mL/min signify possible Chronic Kidney Disease.   No results found for: "CHOL", "HDL", "LDLCALC", "LDLDIRECT", "TRIG", "CHOLHDL" No results found for: "HGBA1C" No results found for: "VITAMINB12" No results found for: "TSH"      No data to display               No data to display           ASSESSMENT AND PLAN  67 y.o. year old female  has a past medical history of Allergy, Epilepsy (HCC), Migraine headache, Osteopenia, and Seizures (HCC). here with    Localization-related epilepsy (HCC) - Plan: levETIRAcetam (KEPPRA) 1000 MG tablet  Chelsea Walsh continues to do well. She will continue levetiracetam 1000mg  BID. We have discussed possibility of weaning ASM in the future. Driving restrictions discussed. She will continue to monitor BP at home. I have encouraged her to schedule visit with PCP asap. Healthy lifestyle habits encouraged. She will return to see me in 1 year, sooner if needed. She verbalizes understanding and agreement with this plan.   No orders of the defined types were placed in this encounter.    Meds ordered this encounter  Medications   levETIRAcetam (KEPPRA) 1000 MG tablet    Sig: Take 1 tablet (1,000 mg total) by mouth 2 (two) times daily.    Dispense:  180 tablet    Refill:  3    Order Specific Question:   Supervising Provider    Answer:   Anson Fret [[id number]]     Shawnie Dapper, MSN, FNP-C 02/09/2023, 1:28 PM  [facility] [address] [place], [place] [address] [phone]

## 2023-02-05 NOTE — Patient Instructions (Signed)
Below is our plan:  We will continue levetiracetam 1000mg twice daily   Please make sure you are consistent with timing of seizure medication. I recommend annual visit with primary care provider (PCP) for complete physical and routine blood work. I recommend daily intake of vitamin D (400-800iu) and calcium (800-1000mg) for bone health. Discuss Dexa screening with PCP.   According to [place] law, you can not drive unless you are seizure / syncope free for at least 6 months and under physician's care.  Please maintain precautions. Do not participate in activities where a loss of awareness could harm you or someone else. No swimming alone, no tub bathing, no hot tubs, no driving, no operating motorized vehicles (cars, ATVs, motocycles, etc), lawnmowers, power tools or firearms. No standing at heights, such as rooftops, ladders or stairs. Avoid hot objects such as stoves, heaters, open fires. Wear a helmet when riding a bicycle, scooter, skateboard, etc. and avoid areas of traffic. Set your water heater to 120 degrees or less.  Please make sure you are staying well hydrated. I recommend 50-60 ounces daily. Well balanced diet and regular exercise encouraged. Consistent sleep schedule with 6-8 hours recommended.   Please continue follow up with care team as directed.   Follow up with me in 1 year   You may receive a survey regarding today's visit. I encourage you to leave honest feed back as I do use this information to improve patient care. Thank you for seeing me today!    

## 2023-02-09 ENCOUNTER — Ambulatory Visit (INDEPENDENT_AMBULATORY_CARE_PROVIDER_SITE_OTHER): Payer: Medicare Other | Admitting: Family Medicine

## 2023-02-09 ENCOUNTER — Encounter: Payer: Self-pay | Admitting: Family Medicine

## 2023-02-09 VITALS — BP 148/97 | HR 70 | Ht 64.5 in | Wt 230.5 lb

## 2023-02-09 DIAGNOSIS — G40109 Localization-related (focal) (partial) symptomatic epilepsy and epileptic syndromes with simple partial seizures, not intractable, without status epilepticus: Secondary | ICD-10-CM

## 2023-02-09 MED ORDER — LEVETIRACETAM 1000 MG PO TABS: 1000.0000 mg | ORAL_TABLET | Freq: Two times a day (BID) | ORAL | 3 refills | Status: DC

## 2024-02-09 NOTE — Progress Notes (Signed)
 Chief Complaint  Patient presents with   Follow-up    Pt in 2 Pt here today for seizure f/u Pt states no questions or concerns for today's visit     HISTORY OF PRESENT ILLNESS:  02/15/24 ALL:  Chelsea Walsh returns for follow up for seizures. She continues levetiracetam  1000mg  BID. She is doing well. No seizure activity. She tolerates med without obvious adverse effects. She would like to wean ASM. Last seizure 2001. No recent aura (ammonia odor). Dose reduced from 1500mg  BID to 1000mg  BID in 02/2015 and she tolerated well. BP is usually well managed at home. She hasn't checked it recently. She has follow up with PCP this month. She rarely drives. She continues to work full time. Works with husband and son who can drive.   02/09/2023 ALL:  Chelsea Walsh is a 68 y.o. female here today for follow up for seizures. She continues levetiracetam  1000mg  twice daily. She denies seizure activity since 2001. She is tolerating med. She is recently insured with medicare. She is planing to schedule visit with her husband's PCP. She checks BP at home and reports readings are always good. She is planning to have some dental work done. She continues to work full time. She drives without difficulty.   12/10/21 ALL (Mychart):  Chelsea Walsh returns for follow up for seizures. She continues to do well on lev 1000mg  BID. She is tolerating meds well with no obvious adverse effects. No seizure activity. Last seizure 2001. She is uninsured.    11/22/2020 ALL (Mychart): Chelsea Walsh is a 68 y.o. female here today for follow up. She continues levetiracetam  1000mg  BID. No recent seizure activity. She is feeling well and without complaints. She has not established care with PCP due to pandemic.    History (copied from Dr Elon Hakim note)    UPDATE (10/11/19, VRP):  - doing well; no seizures; tolerating levetiracetam  1000mg  twice a day - still without insurance; has not been able to establish with PCP   UPDATE (09/07/18, VRP): Since last  visit, doing well. No seizures. No alleviating or aggravating factors. Tolerating levetiracetam . Still uninsured. Still no PCP. No BP machine at home. Planning to get insurance in 2020 through work.     UPDATE 02/25/17: Since last visit, doing well. No seizures. Tolerating LEV 1000mg  twice a day. Still has not seen primary care due to financial limitations and lack of insurance.    UPDATE 02/27/16: Since last visit, doing well. No seizures. Tolerating LEV 1000mg  BID. Occ anxiety stress issues, but manageble.    UPDATE 02/05/15: Since last visit, doing well. No sz. Tolerating LEV 1500mg  BID. Still working and driving.   UPDATE 12/26/13: Patient returns for followup. She's doing well. No further seizures. Patient reports first seizure of life in 1993. Last seizure was in 2001. Patient has been on phenobarbital, Tegretol, Dilantin and Keppra  in the past. Currently on Keppra  1500 mg twice a day. Typical seizures consist of olfactory are of ammonia smell followed by staring spell, confusion, sometimes generalized convulsions. She recalls unilateral convulsions at some point in her life, but does not recall what side of her body.   UPDATE 11/17/12: Chelsea Walsh, 68 year old white female returns today for followup. 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 She also has a history of headaches. She returns today  stating that she has had no seizure activity for 13 years. She has been followed at our office since 1993. 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 Rarely have they progressed to generalized seizures. EEG in 1994 an MRI of the brain were interpreted as normal. She has had multiple medication trials in the past including Tegretol for which she developed a rash. She is currently on Dilantin and Keppra  without difficulty or side effects. She says that she sometimes  does not take her Dilantin because of insurance issues. Her Dexa scan last year shows osteopenia. She is currently taking added calcium and Vitamin D. She denies any confusion or lapses of time, any daytime drowsiness and feelings of being off balance. She does have a thyroid disorder and occasional swelling of the feet, she also has a cyst in her throat and has caused some  swallowing difficulties. No new neurological complaints.   REVIEW OF SYSTEMS: Out of a complete 14 system review of symptoms, the patient complains only of the following symptoms, none and all other reviewed systems are negative.   ALLERGIES: Allergies  Allergen Reactions   Tegretol [Carbamazepine] Hives     HOME MEDICATIONS: Outpatient Medications Prior to Visit  Medication Sig Dispense Refill   calcium-vitamin D (OSCAL WITH D) 500-200 MG-UNIT per tablet Take 1 tablet by mouth daily.     Multiple Vitamins-Minerals (MULTIVITAMIN WITH MINERALS) tablet Take 1 tablet by mouth daily.     levETIRAcetam  (KEPPRA ) 1000 MG tablet Take 1 tablet (1,000 mg total) by mouth 2 (two) times daily. 180 tablet 3   No facility-administered medications prior to visit.     PAST MEDICAL HISTORY: Past Medical History:  Diagnosis Date   Allergy    Epilepsy (HCC)    last seizure 2000   Migraine headache    Osteopenia    Seizures (HCC)    02/27/16 about 16 yrs since last sz     PAST SURGICAL HISTORY: History reviewed. No pertinent surgical history.   FAMILY HISTORY: Family History  Problem Relation Age of Onset   Seizures Neg Hx      SOCIAL HISTORY: Social History   Socioeconomic History   Marital status: Married    Spouse name: Sammie Crigler   Number of children: 3   Years of education: College   Highest education level: Not on file  Occupational History   Occupation: Conservation officer, nature: OTHER    Comment: collect and Copywriter, advertising: OTHER    Comment: [employer]  Tobacco Use   Smoking status: Former    Current  packs/day: 0.00    Average packs/day: 0.7 packs/day for 4.0 years (2.8 ttl pk-yrs)    Types: Cigarettes    Start date: 02/03/2009    Quit date: 02/03/2013    Years since quitting: 11.0   Smokeless tobacco: Never  Vaping Use   Vaping status: Not on file  Substance and Sexual Activity   Alcohol use: No    Alcohol/week: 0.0 standard drinks of alcohol   Drug use: No   Sexual activity: Not on file  Other Topics Concern   Not on file  Social History Narrative   Lives at home with husband   Drinks caffeine: 1-2 cups of coffee and 1 soda a day    Social Drivers of Corporate investment banker Strain: Not on file  Food Insecurity: Not on file  Transportation Needs: Not on file  Physical Activity: Not on file  Stress: Not on file  Social Connections: Not on file  Intimate Partner  Violence: Not on file     PHYSICAL EXAM  Vitals:   02/15/24 0946  BP: (!) 157/88  Pulse: 67  Weight: 228 lb (103.4 kg)  Height: 5\' 4"  (1.626 m)    Body mass index is 39.14 kg/m.  Generalized: Well developed, in no acute distress  Cardiology: normal rate and rhythm, no murmur auscultated  Respiratory: clear to auscultation bilaterally    Neurological examination  Mentation: Alert oriented to time, place, history taking. Follows all commands speech and language fluent Cranial nerve II-XII: Pupils were equal round reactive to light. Extraocular movements were full, visual field were full on confrontational test. Facial sensation and strength were normal. Uvula tongue midline. Head turning and shoulder shrug  were normal and symmetric. Motor: The motor testing reveals 5 over 5 strength of all 4 extremities. Good symmetric motor tone is noted throughout.  Sensory: Sensory testing is intact to soft touch on all 4 extremities. No evidence of extinction is noted.  Coordination: Cerebellar testing reveals good finger-nose-finger and heel-to-shin bilaterally.  Gait and station: Gait is normal. Tandem gait is  normal. Romberg is negative. No drift is seen.  Reflexes: Deep tendon reflexes are symmetric and normal bilaterally.    DIAGNOSTIC DATA (LABS, IMAGING, TESTING) - I reviewed patient records, labs, notes, testing and imaging myself where available.  Lab Results  Component Value Date   WBC 9.4 07/06/2009   HGB 13.3 07/06/2009   HCT 39.0 07/06/2009   MCV 95.3 07/06/2009   PLT 253 07/06/2009      Component Value Date/Time   NA 138 07/06/2009 1940   K 3.9 07/06/2009 1940   CL 103 07/06/2009 1940   CO2 26 07/06/2009 1829   GLUCOSE 88 07/06/2009 1940   BUN 12 07/06/2009 1940   CREATININE 0.7 07/06/2009 1940   CALCIUM 8.7 07/06/2009 1829   PROT 6.4 07/06/2009 1829   ALBUMIN 3.8 07/06/2009 1829   AST 17 07/06/2009 1829   ALT 17 07/06/2009 1829   ALKPHOS 161 (H) 07/06/2009 1829   BILITOT 0.5 07/06/2009 1829   GFRNONAA >60 07/06/2009 1829   GFRAA  07/06/2009 1829    >60        The eGFR has been calculated using the MDRD equation. This calculation has not been validated in all clinical situations. eGFR's persistently <60 mL/min signify possible Chronic Kidney Disease.   No results found for: "CHOL", "HDL", "LDLCALC", "LDLDIRECT", "TRIG", "CHOLHDL" No results found for: "HGBA1C" No results found for: "VITAMINB12" No results found for: "TSH"      No data to display               No data to display           ASSESSMENT AND PLAN  68 y.o. year old female  has a past medical history of Allergy, Epilepsy (HCC), Migraine headache, Osteopenia, and Seizures (HCC). 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 She will continue levetiracetam  1000mg  BID for now. I will order EEG. If normal, we will decrease dose to 750mg  BID for 1 month, then 500mg  BID for 1 month, then 250mg  BID for 1 month. If doing well, she will discontinue. She will monitor closely for any concerns of aura or seizure like activity. Driving  restrictions discussed. She will continue to monitor BP at home. She has appt to establish care with PCP this month. Will continue to monitor BP closely. Healthy lifestyle habits encouraged. She will  return to see me in 1 year, sooner if needed. She verbalizes understanding and agreement with this plan.    Orders Placed This Encounter  Procedures   EEG adult    Standing Status:   Future    Expiration Date:   02/14/2025    Where should this test be performed?:   [facility]    Reason for exam:   Seizure     No orders of the defined types were placed in this encounter.   I spent 30 minutes of face-to-face and non-face-to-face time with patient.  This included previsit chart review, lab review, study review, order entry, electronic health record documentation, patient education.   Terrilyn Fick, MSN, FNP-C 02/15/2024, 10:19 AM  [facility] [address] [place], [place] [address] [phone]

## 2024-02-09 NOTE — Patient Instructions (Addendum)
 Below is our plan:  We will continue levetiracetam  1000mg  twice daily until EEG performed and you hear back from me with results. If normal, start levetiracetam  750mg  twice daily for 1 months. If doing well, we will continue to wean by 250mg  every month. Call me when new refill is needed.   Please make sure you are consistent with timing of seizure medication. I recommend annual visit with primary care provider (PCP) for complete physical and routine blood work. I recommend daily intake of vitamin D (400-800iu) and calcium (800-1000mg ) for bone health. Discuss Dexa screening with PCP.   According to [place] law, you can not drive for 6 months   Please maintain precautions. Do not participate in activities where a loss of awareness could harm you or someone else. No swimming alone, no tub bathing, no hot tubs, no driving, no operating motorized vehicles (cars, ATVs, motocycles, etc), lawnmowers, power tools or firearms. No standing at heights, such as rooftops, ladders (unsupervised) or stairs. Avoid hot objects such as stoves, heaters, open fires. Wear a helmet when riding a bicycle, scooter, skateboard, etc. and avoid areas of traffic. Set your water heater to 120 degrees or less.  SUDEP is the sudden, unexpected death of someone with epilepsy, who was otherwise healthy. In SUDEP cases, no other cause of death is found when an autopsy is done. Each year, more than 1 in 1,000 people with epilepsy die from SUDEP. This is the leading cause of death in people with uncontrolled seizures. Until further answers are available, the best way to prevent SUDEP is to lower your risk by controlling seizures. Research has found that people with all types of epilepsy that experience convulsive seizures can be at risk.  Please make sure you are staying well hydrated. I recommend 50-60 ounces daily. Well balanced diet and regular exercise encouraged. Consistent sleep schedule with 6-8 hours recommended.   Please continue  follow up with care team as directed.   Follow up with me in 6 months   You may receive a survey regarding today's visit. I encourage you to leave honest feed back as I do use this information to improve patient care. Thank you for seeing me today!

## 2024-02-15 ENCOUNTER — Encounter: Payer: Self-pay | Admitting: Family Medicine

## 2024-02-15 ENCOUNTER — Ambulatory Visit (INDEPENDENT_AMBULATORY_CARE_PROVIDER_SITE_OTHER): Payer: Medicare Other | Admitting: Family Medicine

## 2024-02-15 VITALS — BP 157/88 | HR 67 | Ht 64.0 in | Wt 228.0 lb

## 2024-02-15 DIAGNOSIS — G40109 Localization-related (focal) (partial) symptomatic epilepsy and epileptic syndromes with simple partial seizures, not intractable, without status epilepticus: Secondary | ICD-10-CM | POA: Diagnosis not present

## 2024-02-16 ENCOUNTER — Ambulatory Visit (INDEPENDENT_AMBULATORY_CARE_PROVIDER_SITE_OTHER): Admitting: Diagnostic Neuroimaging

## 2024-02-16 DIAGNOSIS — G40109 Localization-related (focal) (partial) symptomatic epilepsy and epileptic syndromes with simple partial seizures, not intractable, without status epilepticus: Secondary | ICD-10-CM

## 2024-02-22 ENCOUNTER — Encounter: Payer: Self-pay | Admitting: Family Medicine

## 2024-02-22 MED ORDER — LEVETIRACETAM 1000 MG PO TABS: 1000.0000 mg | ORAL_TABLET | Freq: Two times a day (BID) | ORAL | 3 refills | Status: AC

## 2024-03-02 NOTE — Procedures (Signed)
   [facility]  EEG (ELECTROENCEPHALOGRAM) REPORT   STUDY DATE: 03/02/24 PATIENT NAME: Chelsea Walsh DOB: [birth year] MRN: [id number]  ORDERING CLINICIAN: Gwendloyn Lemming, MD   TECHNOLOGIST: Author Board TECHNIQUE: Electroencephalogram was recorded utilizing standard 10-20 system of lead placement and reformatted into average and bipolar montages.  RECORDING TIME: 26 minutes ACTIVATION: hyperventilation and photic stimulation  CLINICAL INFORMATION: 68 year old female with seizure.  FINDINGS: Posterior dominant background rhythms, which attenuate with eye opening, ranging 10-11 hertz and 30-35 microvolts. No focal, lateralizing, epileptiform activity or seizures are seen. Patient recorded in the awake and drowsy state. EKG channel shows regular rhythm of 60-65 beats per minute.   IMPRESSION:   Normal EEG in the awake and drowsy states.    INTERPRETING PHYSICIAN:  Omega Bible, MD Certified in Neurology, Neurophysiology and Neuroimaging  [facility] [address] [place], [place] [address] [phone]

## 2024-03-03 ENCOUNTER — Encounter: Payer: Self-pay | Admitting: Nurse Practitioner

## 2024-03-03 ENCOUNTER — Ambulatory Visit (INDEPENDENT_AMBULATORY_CARE_PROVIDER_SITE_OTHER): Admitting: Nurse Practitioner

## 2024-03-03 ENCOUNTER — Ambulatory Visit: Payer: Self-pay | Admitting: Family Medicine

## 2024-03-03 VITALS — BP 124/86 | HR 63 | Temp 97.3°F | Ht 64.0 in | Wt 227.2 lb

## 2024-03-03 DIAGNOSIS — G40909 Epilepsy, unspecified, not intractable, without status epilepticus: Secondary | ICD-10-CM

## 2024-03-03 DIAGNOSIS — Z136 Encounter for screening for cardiovascular disorders: Secondary | ICD-10-CM | POA: Diagnosis not present

## 2024-03-03 DIAGNOSIS — G8929 Other chronic pain: Secondary | ICD-10-CM | POA: Insufficient documentation

## 2024-03-03 DIAGNOSIS — R030 Elevated blood-pressure reading, without diagnosis of hypertension: Secondary | ICD-10-CM | POA: Diagnosis not present

## 2024-03-03 DIAGNOSIS — Z1211 Encounter for screening for malignant neoplasm of colon: Secondary | ICD-10-CM

## 2024-03-03 DIAGNOSIS — M8580 Other specified disorders of bone density and structure, unspecified site: Secondary | ICD-10-CM | POA: Diagnosis not present

## 2024-03-03 DIAGNOSIS — R002 Palpitations: Secondary | ICD-10-CM | POA: Diagnosis not present

## 2024-03-03 DIAGNOSIS — E559 Vitamin D deficiency, unspecified: Secondary | ICD-10-CM | POA: Diagnosis not present

## 2024-03-03 DIAGNOSIS — M25561 Pain in right knee: Secondary | ICD-10-CM

## 2024-03-03 DIAGNOSIS — Z780 Asymptomatic menopausal state: Secondary | ICD-10-CM

## 2024-03-03 DIAGNOSIS — M25562 Pain in left knee: Secondary | ICD-10-CM

## 2024-03-03 DIAGNOSIS — Z1231 Encounter for screening mammogram for malignant neoplasm of breast: Secondary | ICD-10-CM

## 2024-03-03 LAB — COMPREHENSIVE METABOLIC PANEL WITH GFR
ALT: 14 U/L (ref 0–35)
AST: 15 U/L (ref 0–37)
Albumin: 4.4 g/dL (ref 3.5–5.2)
Alkaline Phosphatase: 135 U/L — ABNORMAL HIGH (ref 39–117)
BUN: 14 mg/dL (ref 6–23)
CO2: 29 meq/L (ref 19–32)
Calcium: 9.5 mg/dL (ref 8.4–10.5)
Chloride: 105 meq/L (ref 96–112)
Creatinine, Ser: 0.65 mg/dL (ref 0.40–1.20)
GFR: 91.2 mL/min (ref >60.00)
Glucose, Bld: 90 mg/dL (ref 70–99)
Potassium: 4.3 meq/L (ref 3.5–5.1)
Sodium: 141 meq/L (ref 135–145)
Total Bilirubin: 0.3 mg/dL (ref 0.2–1.2)
Total Protein: 6.9 g/dL (ref 6.0–8.3)

## 2024-03-03 LAB — CBC WITH DIFFERENTIAL/PLATELET
Basophils Absolute: 0.1 10*3/uL (ref 0.0–0.1)
Basophils Relative: 0.8 % (ref 0.0–3.0)
Eosinophils Absolute: 0.1 10*3/uL (ref 0.0–0.7)
Eosinophils Relative: 2.4 % (ref 0.0–5.0)
HCT: 40.2 % (ref 36.0–46.0)
Hemoglobin: 13.4 g/dL (ref 12.0–15.0)
Lymphocytes Relative: 28 % (ref 12.0–46.0)
Lymphs Abs: 1.8 10*3/uL (ref 0.7–4.0)
MCHC: 33.2 g/dL (ref 30.0–36.0)
MCV: 91.1 fl (ref 78.0–100.0)
Monocytes Absolute: 0.6 10*3/uL (ref 0.1–1.0)
Monocytes Relative: 9.4 % (ref 3.0–12.0)
Neutro Abs: 3.7 10*3/uL (ref 1.4–7.7)
Neutrophils Relative %: 59.4 % (ref 43.0–77.0)
Platelets: 294 10*3/uL (ref 150.0–400.0)
RBC: 4.42 Mil/uL (ref 3.87–5.11)
RDW: 12.9 % (ref 11.5–15.5)
WBC: 6.2 10*3/uL (ref 4.0–10.5)

## 2024-03-03 LAB — VITAMIN D 25 HYDROXY (VIT D DEFICIENCY, FRACTURES): VITD: <7 ng/mL — ABNORMAL LOW (ref 30.00–100.00)

## 2024-03-03 LAB — LIPID PANEL
Cholesterol: 162 mg/dL (ref 0–200)
HDL: 51.5 mg/dL (ref >39.00)
LDL Cholesterol: 86 mg/dL (ref 0–99)
NonHDL: 110.58
Total CHOL/HDL Ratio: 3
Triglycerides: 124 mg/dL (ref 0.0–149.0)
VLDL: 24.8 mg/dL (ref 0.0–40.0)

## 2024-03-03 LAB — TSH: TSH: 1.66 u[IU]/mL (ref 0.35–5.50)

## 2024-03-03 NOTE — Progress Notes (Signed)
 New Patient Visit  BP 124/86 (BP Location: Left Arm, Patient Position: Sitting, Cuff Size: Normal)   Pulse 63   Temp (!) 97.3 F (36.3 C)   Ht 5\' 4"  (1.626 m)   Wt 227 lb 3.2 oz (103.1 kg)   SpO2 96%   BMI 39.00 kg/m    Subjective:    Patient ID: Chelsea Walsh, female    DOB: 1956-09-14, 68 y.o.   MRN: [id number]  CC: Chief Complaint  Patient presents with   Establish Care    NP. Est. Care, concerns with elevated blood pressure    HPI: Chelsea Walsh is a 68 y.o. female presents for new patient visit to establish care.  Introduced to Publishing rights manager role and practice setting.  All questions answered.  Discussed provider/patient relationship and expectations.  Discussed the use of AI scribe software for clinical note transcription with the patient, who gave verbal consent to proceed.  History of Present Illness   Chelsea Walsh is a 68 year old female who presents to establish care.  She has a history of seizures, with the last episode in 2001, and is on Keppra  1000 mg twice daily. A recent EEG was normal. She follows regularly with neurology and is discussing weaning off keppra  since she hasn't had any seizures recently.   She has osteopenia and takes calcium and vitamin D supplements. Her last bone density scan was in 2012.  Her blood pressure is intermittently elevated, especially during medical and dental visits. She experiences dizziness and lightheadedness upon standing, with occasional palpitations at night. She denies chest pain or shortness of breath.  She experiences frequent headaches, managed with Tylenol or ibuprofen, particularly during computer work. She feels tired and attributes this to her age.  She has joint pain, especially in her knees and a previously broken ankle, which has started to hurt again. She attributes this to age and weight.  She experiences occasional anxiety and stress related to her husband's health but denies frequent panic attacks. She  sometimes feels down or sad, but not consistently. She quit smoking in 2013 and does not consume alcohol. She has a family history of diabetes.        03/03/2024   10:16 AM  Depression screen PHQ 2/9  Decreased Interest 0  Down, Depressed, Hopeless 0  PHQ - 2 Score 0  Altered sleeping 0  Tired, decreased energy 0  Change in appetite 0  Feeling bad or failure about yourself  0  Trouble concentrating 0  Moving slowly or fidgety/restless 0  Suicidal thoughts 0  PHQ-9 Score 0  Difficult doing work/chores Not difficult at all      03/03/2024   10:16 AM  GAD 7 : Generalized Anxiety Score  Nervous, Anxious, on Edge 0  Control/stop worrying 0  Worry too much - different things 0  Trouble relaxing 0  Restless 0  Easily annoyed or irritable 1  Afraid - awful might happen 1  Total GAD 7 Score 2  Anxiety Difficulty Not difficult at all    Past Medical History:  Diagnosis Date   Allergy    Epilepsy (HCC)    last seizure 2000   Migraine headache    Osteopenia    Seizures (HCC)    02/27/16 about 16 yrs since last sz    Past Surgical History:  Procedure Laterality Date   CESAREAN SECTION  1979   First pregnancy   TUBAL LIGATION  1989   Last pregnancy    Family History  Problem Relation Age of Onset   Alcohol abuse Father    Diabetes Father    Seizures Neg Hx      Social History   Tobacco Use   Smoking status: Former    Current packs/day: 0.00    Average packs/day: 0.7 packs/day for 4.0 years (2.8 ttl pk-yrs)    Types: Cigarettes    Start date: 02/03/2009    Quit date: 02/03/2013    Years since quitting: 11.0   Smokeless tobacco: Never   Tobacco comments:    Was never a heavy smoker and have not smoked since 2013.  Substance Use Topics   Alcohol use: Not Currently   Drug use: No    Current Outpatient Medications on File Prior to Visit  Medication Sig Dispense Refill   diphenhydrAMINE (BENADRYL ALLERGY) 25 mg capsule Take 25 mg by mouth at bedtime.      Ibuprofen-diphenhydrAMINE Cit (IBUPROFEN PM PO) Take by mouth at bedtime.     levETIRAcetam  (KEPPRA ) 1000 MG tablet Take 1 tablet (1,000 mg total) by mouth 2 (two) times daily. 180 tablet 3   Multiple Vitamins-Minerals (MULTIVITAMIN WITH MINERALS) tablet Take 1 tablet by mouth daily.     calcium-vitamin D  (OSCAL WITH D) 500-200 MG-UNIT per tablet Take 1 tablet by mouth daily. (Patient not taking: Reported on 03/03/2024)     No current facility-administered medications on file prior to visit.     Review of Systems  Constitutional:  Positive for fatigue. Negative for fever.  HENT: Negative.    Eyes: Negative.   Respiratory: Negative.    Cardiovascular:  Positive for palpitations (at times). Negative for chest pain.  Gastrointestinal: Negative.   Endocrine: Negative.   Genitourinary: Negative.   Musculoskeletal:  Positive for arthralgias.  Skin: Negative.   Neurological:  Positive for dizziness (at times with changing positions) and headaches.  Psychiatric/Behavioral:  Negative for dysphoric mood. The patient is nervous/anxious.       Objective:     BP 124/86 (BP Location: Left Arm, Patient Position: Sitting, Cuff Size: Normal)   Pulse 63   Temp (!) 97.3 F (36.3 C)   Ht 5\' 4"  (1.626 m)   Wt 227 lb 3.2 oz (103.1 kg)   SpO2 96%   BMI 39.00 kg/m   Wt Readings from Last 3 Encounters:  03/03/24 227 lb 3.2 oz (103.1 kg)  02/15/24 228 lb (103.4 kg)  02/09/23 230 lb 8 oz (104.6 kg)    BP Readings from Last 3 Encounters:  03/03/24 124/86  02/15/24 (!) 157/88  02/09/23 (!) 148/97    Physical Exam Vitals and nursing note reviewed.  Constitutional:      General: She is not in acute distress.    Appearance: Normal appearance.  HENT:     Head: Normocephalic and atraumatic.     Right Ear: Tympanic membrane, ear canal and external ear normal.     Left Ear: Tympanic membrane, ear canal and external ear normal.  Eyes:     Conjunctiva/sclera: Conjunctivae normal.  Cardiovascular:      Rate and Rhythm: Normal rate and regular rhythm.     Pulses: Normal pulses.     Heart sounds: Normal heart sounds.  Pulmonary:     Effort: Pulmonary effort is normal.     Breath sounds: Normal breath sounds.  Abdominal:     Palpations: Abdomen is soft.     Tenderness: There is no abdominal tenderness.  Musculoskeletal:        General: Normal range of motion.  Cervical back: Normal range of motion and neck supple.     Right lower leg: No edema.     Left lower leg: No edema.  Lymphadenopathy:     Cervical: No cervical adenopathy.  Skin:    General: Skin is warm and dry.  Neurological:     General: No focal deficit present.     Mental Status: She is alert and oriented to person, place, and time.     Cranial Nerves: No cranial nerve deficit.     Coordination: Coordination normal.     Gait: Gait normal.  Psychiatric:        Mood and Affect: Mood normal.        Behavior: Behavior normal.        Thought Content: Thought content normal.        Judgment: Judgment normal.        Assessment & Plan:   Problem List Items Addressed This Visit       Nervous and Auditory   Seizure disorder (HCC) - Primary (Chronic)   Her seizure disorder is well-controlled with Keppra , with the last seizure occurring in 2001 and a recent normal EEG. Although the neurologist suggested weaning off Keppra , she prefers to delay this due to her husband's upcoming surgery and her driving needs. Continue Keppra  1000 mg twice daily and follow up with neurology for potential medication adjustment after her husband's surgery. Neurology notes reviewed.       Relevant Medications   Ibuprofen-diphenhydrAMINE Cit (IBUPROFEN PM PO)     Musculoskeletal and Integument   Osteopenia   She has osteopenia, with the last bone density scan in 2012 and T score -2.3, and is taking calcium and vitamin D supplements. Order a DEXA scan for bone density assessment and continue calcium and vitamin D supplementation.        Relevant Orders   DG Bone Density     Other   Chronic pain of both knees   She experiences joint pain primarily in her knees and previously injured ankle, likely related to age and weight. Encourage low-impact exercises such as walking, biking, or swimming.      Elevated blood pressure reading   Her blood pressure is intermittently elevated but was normal today at 124/86 mmHg, suggesting possible white coat hypertension. There is no current need for medication. She should monitor her blood pressure at home twice a week, reduce dietary sodium intake, and engage in regular exercise such as walking or swimming. Check CMP, CBC today.       Other Visit Diagnoses       Encounter for screening mammogram for malignant neoplasm of breast       Mammogram ordered today.   Relevant Orders   MM 3D SCREENING MAMMOGRAM BILATERAL BREAST     Postmenopausal estrogen deficiency       Relevant Orders   DG Bone Density     Vitamin D insufficiency       Check vitamin D levels today and adjust regimen based on results.   Relevant Orders   VITAMIN D 25 Hydroxy (Vit-D Deficiency, Fractures) (Completed)     Palpitations       Intermittent palpitations, once a week. Check CMP, CBC, TSH today. Limit caffeine. Follow-up if become more frequent or symptomatic.   Relevant Orders   CBC with Differential/Platelet (Completed)   Comprehensive metabolic panel with GFR (Completed)   TSH (Completed)     Screening for cardiovascular condition       Screen  lipid panel today   Relevant Orders   Lipid panel (Completed)     Screen for colon cancer       Colguard ordered today   Relevant Orders   Cologuard        Follow up plan: Return in about 6 months (around 09/03/2024) for CPE.  Susana Duell A Bobak Oguinn

## 2024-03-03 NOTE — Assessment & Plan Note (Signed)
 Her blood pressure is intermittently elevated but was normal today at 124/86 mmHg, suggesting possible white coat hypertension. There is no current need for medication. She should monitor her blood pressure at home twice a week, reduce dietary sodium intake, and engage in regular exercise such as walking or swimming. Check CMP, CBC today.

## 2024-03-03 NOTE — Assessment & Plan Note (Signed)
 Her seizure disorder is well-controlled with Keppra , with the last seizure occurring in 2001 and a recent normal EEG. Although the neurologist suggested weaning off Keppra , she prefers to delay this due to her husband's upcoming surgery and her driving needs. Continue Keppra  1000 mg twice daily and follow up with neurology for potential medication adjustment after her husband's surgery. Neurology notes reviewed.

## 2024-03-03 NOTE — Assessment & Plan Note (Signed)
 She experiences joint pain primarily in her knees and previously injured ankle, likely related to age and weight. Encourage low-impact exercises such as walking, biking, or swimming.

## 2024-03-03 NOTE — Assessment & Plan Note (Signed)
 She has osteopenia, with the last bone density scan in 2012 and T score -2.3, and is taking calcium and vitamin D supplements. Order a DEXA scan for bone density assessment and continue calcium and vitamin D supplementation.

## 2024-03-03 NOTE — Patient Instructions (Signed)
 It was great to see you!  We are checking your labs today and will let you know the results via mychart/phone.   You are due for a tetanus booster, you can get this at your pharmacy   Start checking your blood pressure at home twice a week and write it down   Try to limit of salt you are eating  I have placed an order for screening mammogram, they should call you to schedule. If you do not hear from them in the next week, please call:  [facility] [address] [place], [address] [place], [place] [phone]  Let's follow-up in 6 months, sooner if you have concerns.  If a referral was placed today, you will be contacted for an appointment. Please note that routine referrals can sometimes take up to 3-4 weeks to process. Please call our office if you haven't heard anything after this time frame.  Take care,  Rheba Cedar, NP

## 2024-03-04 ENCOUNTER — Ambulatory Visit: Payer: Self-pay | Admitting: Nurse Practitioner

## 2024-03-04 DIAGNOSIS — Z780 Asymptomatic menopausal state: Secondary | ICD-10-CM

## 2024-03-04 MED ORDER — VITAMIN D (ERGOCALCIFEROL) 1.25 MG (50000 UNIT) PO CAPS: 50000.0000 [IU] | ORAL_CAPSULE | ORAL | 0 refills | Status: DC

## 2024-03-04 NOTE — Addendum Note (Signed)
 Addended by: Rokia Bosket A on: 03/04/2024 04:39 PM   Modules accepted: Orders

## 2024-03-09 ENCOUNTER — Ambulatory Visit
Admission: RE | Admit: 2024-03-09 | Discharge: 2024-03-09 | Disposition: A | Discharge status: Home / Self Care | Source: Ambulatory Visit | Attending: Nurse Practitioner | Admitting: Nurse Practitioner

## 2024-03-09 DIAGNOSIS — Z1231 Encounter for screening mammogram for malignant neoplasm of breast: Secondary | ICD-10-CM

## 2024-03-15 NOTE — Addendum Note (Signed)
 Addended by: Solenne Manwarren A on: 03/15/2024 12:55 PM   Modules accepted: Orders

## 2024-03-17 LAB — COLOGUARD: COLOGUARD: NEGATIVE

## 2024-04-27 ENCOUNTER — Ambulatory Visit

## 2024-04-27 ENCOUNTER — Ambulatory Visit: Payer: Self-pay | Admitting: Nurse Practitioner

## 2024-04-27 DIAGNOSIS — Z13820 Encounter for screening for osteoporosis: Secondary | ICD-10-CM

## 2024-04-27 DIAGNOSIS — Z780 Asymptomatic menopausal state: Secondary | ICD-10-CM

## 2024-05-20 ENCOUNTER — Other Ambulatory Visit: Payer: Self-pay | Admitting: Nurse Practitioner

## 2024-05-20 NOTE — Telephone Encounter (Signed)
 Requesting: VIT D2 (ERGOCAL) 1.25MG (50,000U) CP  Last Visit: 03/03/2024 Next Visit: 09/05/2024 Last Refill: 03/04/2024  Please Advise

## 2024-08-09 ENCOUNTER — Other Ambulatory Visit: Payer: Self-pay | Admitting: Nurse Practitioner

## 2024-08-09 NOTE — Telephone Encounter (Signed)
 Requesting: Vitamin D , Ergocalciferol , (DRISDOL ) 1.25 MG (50000 UNIT) Cap capsule Last Visit: 03/03/2024 Next Visit: 09/05/2024 Last Refill: 05/20/2024  Please Advise

## 2024-08-09 NOTE — Telephone Encounter (Signed)
 LVMTCB .SABRA ATC patient to inform her to pick up Vitamin D2 OTC. Sending my chart message

## 2024-09-05 ENCOUNTER — Ambulatory Visit: Admitting: Nurse Practitioner

## 2024-09-15 ENCOUNTER — Encounter: Payer: Self-pay | Admitting: Nurse Practitioner

## 2024-09-15 ENCOUNTER — Ambulatory Visit: Admitting: Nurse Practitioner

## 2024-09-15 ENCOUNTER — Ambulatory Visit

## 2024-09-15 VITALS — BP 128/84 | HR 73 | Temp 97.2°F | Ht 64.0 in | Wt 231.4 lb

## 2024-09-15 DIAGNOSIS — H02844 Edema of left upper eyelid: Secondary | ICD-10-CM

## 2024-09-15 DIAGNOSIS — G8929 Other chronic pain: Secondary | ICD-10-CM

## 2024-09-15 DIAGNOSIS — G40909 Epilepsy, unspecified, not intractable, without status epilepticus: Secondary | ICD-10-CM

## 2024-09-15 DIAGNOSIS — E559 Vitamin D deficiency, unspecified: Secondary | ICD-10-CM | POA: Insufficient documentation

## 2024-09-15 DIAGNOSIS — R748 Abnormal levels of other serum enzymes: Secondary | ICD-10-CM

## 2024-09-15 DIAGNOSIS — M8580 Other specified disorders of bone density and structure, unspecified site: Secondary | ICD-10-CM

## 2024-09-15 LAB — CBC WITH DIFFERENTIAL/PLATELET
Basophils Absolute: 0 K/uL (ref 0.0–0.1)
Basophils Relative: 0.8 % (ref 0.0–3.0)
Eosinophils Absolute: 0.1 K/uL (ref 0.0–0.7)
Eosinophils Relative: 2.5 % (ref 0.0–5.0)
HCT: 39.9 % (ref 36.0–46.0)
Hemoglobin: 13.2 g/dL (ref 12.0–15.0)
Lymphocytes Relative: 25.2 % (ref 12.0–46.0)
Lymphs Abs: 1.5 K/uL (ref 0.7–4.0)
MCHC: 33.2 g/dL (ref 30.0–36.0)
MCV: 90.8 fl (ref 78.0–100.0)
Monocytes Absolute: 0.6 K/uL (ref 0.1–1.0)
Monocytes Relative: 10.5 % (ref 3.0–12.0)
Neutro Abs: 3.6 K/uL (ref 1.4–7.7)
Neutrophils Relative %: 61 % (ref 43.0–77.0)
Platelets: 313 K/uL (ref 150.0–400.0)
RBC: 4.39 Mil/uL (ref 3.87–5.11)
RDW: 13 % (ref 11.5–15.5)
WBC: 5.9 K/uL (ref 4.0–10.5)

## 2024-09-15 LAB — COMPREHENSIVE METABOLIC PANEL WITH GFR
ALT: 12 U/L (ref 0–35)
AST: 15 U/L (ref 0–37)
Albumin: 4.2 g/dL (ref 3.5–5.2)
Alkaline Phosphatase: 132 U/L — ABNORMAL HIGH (ref 39–117)
BUN: 10 mg/dL (ref 6–23)
CO2: 28 meq/L (ref 19–32)
Calcium: 9.4 mg/dL (ref 8.4–10.5)
Chloride: 105 meq/L (ref 96–112)
Creatinine, Ser: 0.63 mg/dL (ref 0.40–1.20)
GFR: 91.55 mL/min (ref >60.00)
Glucose, Bld: 93 mg/dL (ref 70–99)
Potassium: 3.9 meq/L (ref 3.5–5.1)
Sodium: 139 meq/L (ref 135–145)
Total Bilirubin: 0.3 mg/dL (ref 0.2–1.2)
Total Protein: 7.1 g/dL (ref 6.0–8.3)

## 2024-09-15 LAB — VITAMIN D 25 HYDROXY (VIT D DEFICIENCY, FRACTURES): VITD: 22.69 ng/mL — ABNORMAL LOW (ref 30.00–100.00)

## 2024-09-15 LAB — GAMMA GT: GGT: 22 U/L (ref 7–51)

## 2024-09-15 NOTE — Progress Notes (Unsigned)
 BP 128/84 (BP Location: Left Arm, Patient Position: Sitting, Cuff Size: Normal)   Pulse 73   Temp (!) 97.2 F (36.2 C)   Ht 5' 4 (1.626 m)   Wt 231 lb 6.4 oz (105 kg)   SpO2 96%   BMI 39.72 kg/m    Subjective:    Patient ID: Chelsea Walsh, female    DOB: 1956-06-17, 68 y.o.   MRN: [id number]  CC: Chief Complaint  Patient presents with   Eye Problem    Left eyelid swollen for 1 day, left ankle swelling/pain    HPI: Chelsea Walsh is a 68 y.o. female presenting on 09/15/2024 for comprehensive medical examination. Current medical complaints include:left ankle pain, left eyelid swollen  She currently lives with: husband Menopausal Symptoms: no  Depression and Anxiety Screen done today and results listed below:     09/15/2024    8:05 AM 03/03/2024   10:16 AM  Depression screen PHQ 2/9  Decreased Interest 0 0  Down, Depressed, Hopeless 0 0  PHQ - 2 Score 0 0  Altered sleeping  0  Tired, decreased energy  0  Change in appetite  0  Feeling bad or failure about yourself   0  Trouble concentrating  0  Moving slowly or fidgety/restless  0  Suicidal thoughts  0  PHQ-9 Score  0   Difficult doing work/chores  Not difficult at all     Data saved with a previous flowsheet row definition      03/03/2024   10:16 AM  GAD 7 : Generalized Anxiety Score  Nervous, Anxious, on Edge 0  Control/stop worrying 0  Worry too much - different things 0  Trouble relaxing 0  Restless 0  Easily annoyed or irritable 1  Afraid - awful might happen 1  Total GAD 7 Score 2  Anxiety Difficulty Not difficult at all    The patient does not have a history of falls. I did not complete a risk assessment for falls. A plan of care for falls was not documented.   Past Medical History:  Past Medical History:  Diagnosis Date   Allergy    Epilepsy (HCC)    last seizure 2000   Migraine headache    Osteopenia    Osteoporosis    Seizures (HCC)    02/27/16 about 16 yrs since last sz    Surgical  History:  Past Surgical History:  Procedure Laterality Date   CESAREAN SECTION  1979   First pregnancy   TUBAL LIGATION  1989   Last pregnancy    Medications:  Current Outpatient Medications on File Prior to Visit  Medication Sig   calcium-vitamin D  (OSCAL WITH D) 500-200 MG-UNIT per tablet Take 1 tablet by mouth daily.   diphenhydrAMINE (BENADRYL ALLERGY) 25 mg capsule Take 25 mg by mouth at bedtime.   Ibuprofen-diphenhydrAMINE Cit (IBUPROFEN PM PO) Take by mouth at bedtime.   levETIRAcetam  (KEPPRA ) 1000 MG tablet Take 1 tablet (1,000 mg total) by mouth 2 (two) times daily.   Multiple Vitamins-Minerals (MULTIVITAMIN WITH MINERALS) tablet Take 1 tablet by mouth daily.   Vitamin D , Ergocalciferol , (DRISDOL ) 1.25 MG (50000 UNIT) CAPS capsule TAKE ONE CAPSULE BY MOUTH EVERY 7 DAYS   No current facility-administered medications on file prior to visit.    Allergies:  Allergies[1]  Social History:  Social History   Socioeconomic History   Marital status: Married    Spouse name: Elsie   Number of children: 3   Years of education:  College   Highest education level: 12th grade  Occupational History   Occupation: Conservation Officer, Nature: OTHER    Comment: collect and press    Employer: OTHER    Comment: [employer]  Tobacco Use   Smoking status: Former    Current packs/day: 0.00    Average packs/day: 0.7 packs/day for 4.0 years (2.8 ttl pk-yrs)    Types: Cigarettes    Start date: 02/03/2009    Quit date: 02/03/2013    Years since quitting: 11.6   Smokeless tobacco: Never   Tobacco comments:    Was never a heavy smoker and have not smoked since 2013.  Vaping Use   Vaping status: Not on file  Substance and Sexual Activity   Alcohol use: Not Currently   Drug use: No   Sexual activity: Not Currently    Birth control/protection: Post-menopausal  Other Topics Concern   Not on file  Social History Narrative   Lives at home with husband   Drinks caffeine: 1-2 cups of coffee and 1  soda a day    Social Drivers of Health   Tobacco Use: Medium Risk (09/15/2024)   Patient History    Smoking Tobacco Use: Former    Smokeless Tobacco Use: Never    Passive Exposure: Not on file  Financial Resource Strain: Medium Risk (09/11/2024)   Overall Financial Resource Strain (CARDIA)    Difficulty of Paying Living Expenses: Somewhat hard  Food Insecurity: No Food Insecurity (09/11/2024)   Epic    Worried About Radiation Protection Practitioner of Food in the Last Year: Never true    Ran Out of Food in the Last Year: Never true  Transportation Needs: No Transportation Needs (09/11/2024)   Epic    Lack of Transportation (Medical): No    Lack of Transportation (Non-Medical): No  Physical Activity: Inactive (09/11/2024)   Exercise Vital Sign    Days of Exercise per Week: 0 days    Minutes of Exercise per Session: Not on file  Stress: Stress Concern Present (09/11/2024)   [employer] of Occupational Health - Occupational Stress Questionnaire    Feeling of Stress: To some extent  Social Connections: Socially Isolated (09/11/2024)   Social Connection and Isolation Panel    Frequency of Communication with Friends and Family: Never    Frequency of Social Gatherings with Friends and Family: Never    Attends Religious Services: Never    Database Administrator or Organizations: No    Attends Engineer, Structural: Not on file    Marital Status: Married  Catering Manager Violence: Not on file  Depression (PHQ2-9): Low Risk (09/15/2024)   Depression (PHQ2-9)    PHQ-2 Score: 0  Alcohol Screen: Low Risk (09/11/2024)   Alcohol Screen    Last Alcohol Screening Score (AUDIT): 3  Housing: Low Risk (09/11/2024)   Epic    Unable to Pay for Housing in the Last Year: No    Number of Times Moved in the Last Year: 0    Homeless in the Last Year: No  Utilities: Not on file  Health Literacy: Not on file   Tobacco Use History[2] Social History   Substance and Sexual Activity  Alcohol Use Not Currently     Family History:  Family History  Problem Relation Age of Onset   Alcohol abuse Father    Diabetes Father    Seizures Neg Hx     Past medical history, surgical history, medications, allergies, family history and social history reviewed  with patient today and changes made to appropriate areas of the chart.   Review of Systems  Constitutional: Negative.   HENT: Negative.    Eyes:  Negative for pain and redness.       Left eyelid swelling  Respiratory: Negative.    Cardiovascular: Negative.   Gastrointestinal: Negative.   Genitourinary: Negative.   Musculoskeletal:  Positive for back pain and joint pain (left ankle).  Skin: Negative.   Neurological: Negative.   Psychiatric/Behavioral: Negative.     All other ROS negative except what is listed above and in the HPI.      Objective:    BP 128/84 (BP Location: Left Arm, Patient Position: Sitting, Cuff Size: Normal)   Pulse 73   Temp (!) 97.2 F (36.2 C)   Ht 5' 4 (1.626 m)   Wt 231 lb 6.4 oz (105 kg)   SpO2 96%   BMI 39.72 kg/m   Wt Readings from Last 3 Encounters:  09/15/24 231 lb 6.4 oz (105 kg)  03/03/24 227 lb 3.2 oz (103.1 kg)  02/15/24 228 lb (103.4 kg)    Physical Exam  Results for orders placed or performed in visit on 03/03/24  CBC with Differential/Platelet   Collection Time: 03/03/24 10:42 AM  Result Value Ref Range   WBC 6.2 4.0 - 10.5 K/uL   RBC 4.42 3.87 - 5.11 Mil/uL   Hemoglobin 13.4 12.0 - 15.0 g/dL   HCT 59.7 63.9 - 53.9 %   MCV 91.1 78.0 - 100.0 fl   MCHC 33.2 30.0 - 36.0 g/dL   RDW 87.0 88.4 - 84.4 %   Platelets 294.0 150.0 - 400.0 K/uL   Neutrophils Relative % 59.4 43.0 - 77.0 %   Lymphocytes Relative 28.0 12.0 - 46.0 %   Monocytes Relative 9.4 3.0 - 12.0 %   Eosinophils Relative 2.4 0.0 - 5.0 %   Basophils Relative 0.8 0.0 - 3.0 %   Neutro Abs 3.7 1.4 - 7.7 K/uL   Lymphs Abs 1.8 0.7 - 4.0 K/uL   Monocytes Absolute 0.6 0.1 - 1.0 K/uL   Eosinophils Absolute 0.1 0.0 - 0.7 K/uL    Basophils Absolute 0.1 0.0 - 0.1 K/uL  Comprehensive metabolic panel with GFR   Collection Time: 03/03/24 10:42 AM  Result Value Ref Range   Sodium 141 135 - 145 mEq/L   Potassium 4.3 3.5 - 5.1 mEq/L   Chloride 105 96 - 112 mEq/L   CO2 29 19 - 32 mEq/L   Glucose, Bld 90 70 - 99 mg/dL   BUN 14 6 - 23 mg/dL   Creatinine, Ser 9.34 0.40 - 1.20 mg/dL   Total Bilirubin 0.3 0.2 - 1.2 mg/dL   Alkaline Phosphatase 135 (H) 39 - 117 U/L   AST 15 0 - 37 U/L   ALT 14 0 - 35 U/L   Total Protein 6.9 6.0 - 8.3 g/dL   Albumin 4.4 3.5 - 5.2 g/dL   GFR 08.79 >39.99 mL/min   Calcium 9.5 8.4 - 10.5 mg/dL  Lipid panel   Collection Time: 03/03/24 10:42 AM  Result Value Ref Range   Cholesterol 162 0 - 200 mg/dL   Triglycerides 875.9 0.0 - 149.0 mg/dL   HDL 48.49 >60.99 mg/dL   VLDL 75.1 0.0 - 59.9 mg/dL   LDL Cholesterol 86 0 - 99 mg/dL   Total CHOL/HDL Ratio 3    NonHDL 110.58   TSH   Collection Time: 03/03/24 10:42 AM  Result Value Ref Range  TSH 1.66 0.35 - 5.50 uIU/mL  VITAMIN D  25 Hydroxy (Vit-D Deficiency, Fractures)   Collection Time: 03/03/24 10:42 AM  Result Value Ref Range   VITD <7.00 (L) 30.00 - 100.00 ng/mL  Cologuard   Collection Time: 03/11/24  5:00 PM  Result Value Ref Range   COLOGUARD Negative Negative      Assessment & Plan:   Problem List Items Addressed This Visit   None    Follow up plan: No follow-ups on file.   LABORATORY TESTING:  - Pap smear: not applicable  IMMUNIZATIONS:   - Tdap: Tetanus vaccination status reviewed: last tetanus booster within 10 years. - Influenza: Up to date - Pneumovax: Not applicable - Prevnar: Up to date - HPV: Not applicable - Shingrix vaccine: Up to date  SCREENING: -Mammogram: Up to date  - Colonoscopy: Up to date  - Bone Density: Up to dated   PATIENT COUNSELING:   Advised to take 1 mg of folate supplement per day if capable of pregnancy.   Sexuality: Discussed sexually transmitted diseases, partner selection, use  of condoms, avoidance of unintended pregnancy  and contraceptive alternatives.   Advised to avoid cigarette smoking.  I discussed with the patient that most people either abstain from alcohol or drink within safe limits (<=14/week and <=4 drinks/occasion for males, <=7/weeks and <= 3 drinks/occasion for females) and that the risk for alcohol disorders and other health effects rises proportionally with the number of drinks per week and how often a drinker exceeds daily limits.  Discussed cessation/primary prevention of drug use and availability of treatment for abuse.   Diet: Encouraged to adjust caloric intake to maintain  or achieve ideal body weight, to reduce intake of dietary saturated fat and total fat, to limit sodium intake by avoiding high sodium foods and not adding table salt, and to maintain adequate dietary potassium and calcium preferably from fresh fruits, vegetables, and low-fat dairy products.    stressed the importance of regular exercise  Injury prevention: Discussed safety belts, safety helmets, smoke detector, smoking near bedding or upholstery.   Dental health: Discussed importance of regular tooth brushing, flossing, and dental visits.    NEXT PREVENTATIVE PHYSICAL DUE IN 1 YEAR. No follow-ups on file.  Kayvan Hoefling A Nollie Shiflett    [1]  Allergies Allergen Reactions   Tegretol [Carbamazepine] Hives  [2]  Social History Tobacco Use  Smoking Status Former   Current packs/day: 0.00   Average packs/day: 0.7 packs/day for 4.0 years (2.8 ttl pk-yrs)   Types: Cigarettes   Start date: 02/03/2009   Quit date: 02/03/2013   Years since quitting: 11.6  Smokeless Tobacco Never  Tobacco Comments   Was never a heavy smoker and have not smoked since 2013.

## 2024-09-15 NOTE — Patient Instructions (Signed)
 It was great to see you!  We are checking your labs today and will let you know the results via mychart/phone.   We are getting an xray of your ankle   Keep wearing the ankle brace or compression wrap during the day, especially if you will stand for long periods   Start taking claritin/zyrtec/benadryl for the eyelid  Start the stretches every day for your back   Let's follow-up in 6 months, sooner if you have concerns.  If a referral was placed today, you will be contacted for an appointment. Please note that routine referrals can sometimes take up to 3-4 weeks to process. Please call our office if you haven't heard anything after this time frame.  Take care,  Tinnie Harada, NP

## 2024-09-16 ENCOUNTER — Ambulatory Visit: Payer: Self-pay | Admitting: Nurse Practitioner

## 2024-09-16 DIAGNOSIS — G8929 Other chronic pain: Secondary | ICD-10-CM | POA: Insufficient documentation

## 2024-09-16 MED ORDER — VITAMIN D (ERGOCALCIFEROL) 1.25 MG (50000 UNIT) PO CAPS: 50000.0000 [IU] | ORAL_CAPSULE | ORAL | 0 refills | Status: AC

## 2024-09-16 NOTE — Assessment & Plan Note (Addendum)
 Chronic bilateral knee pain with sciatica is present, likely of musculoskeletal origin. Daily stretching exercises for the back and sciatica are provided. She should continue using ibuprofen as needed for pain management.

## 2024-09-16 NOTE — Assessment & Plan Note (Signed)
 BMI 39.7 associated with chronic knee pain. Discussed nutrition, exercise.

## 2024-09-16 NOTE — Assessment & Plan Note (Signed)
 Chronic left ankle pain and swelling are exacerbated by prolonged standing and not wearing a brace. An x-ray of the left ankle is ordered. She should wear an ankle compression sleeve for support and swelling reduction and continue using the ankle brace as needed.

## 2024-09-16 NOTE — Assessment & Plan Note (Signed)
Check vitamin D and treat based on results.  ?

## 2024-09-16 NOTE — Assessment & Plan Note (Signed)
 Chronic, stable. T score has improved and as of 04/2024 it is -1.7. Continue calcium and vitamin D  supplementation and regular exercise.

## 2024-09-16 NOTE — Assessment & Plan Note (Signed)
 Her seizure disorder is well-controlled with Keppra , with the last seizure occurring in 2001 and a recent normal EEG. Although the neurologist suggested weaning off Keppra , she prefers to delay this due to her husband's upcoming surgery and her driving needs. Continue Keppra  1000 mg twice daily and follow up with neurology for potential medication adjustment after her husband's surgery. Neurology notes reviewed.

## 2024-10-10 ENCOUNTER — Encounter: Payer: Self-pay | Admitting: Family Medicine

## 2024-10-10 ENCOUNTER — Other Ambulatory Visit: Payer: Self-pay | Admitting: Nurse Practitioner

## 2024-10-10 ENCOUNTER — Ambulatory Visit: Admitting: Orthopedic Surgery

## 2024-10-11 NOTE — Telephone Encounter (Signed)
 Requesting: VIT D2 (ERGOCAL) 1.25MG (50,000U) CP  Last Visit: 09/15/2024 Next Visit: 03/16/2025 Last Refill: 09/16/2024  Please Advise

## 2024-10-24 ENCOUNTER — Ambulatory Visit: Admitting: Orthopedic Surgery

## 2024-10-24 ENCOUNTER — Encounter: Payer: Self-pay | Admitting: Orthopedic Surgery

## 2024-10-24 DIAGNOSIS — M76822 Posterior tibial tendinitis, left leg: Secondary | ICD-10-CM

## 2024-10-24 NOTE — Progress Notes (Signed)
 "  Office Visit Note   Patient: Chelsea Walsh           Date of Birth: 31-Mar-1956           MRN: [id number] Visit Date: 10/24/2024              Requested by: Harden Jerona GAILS, MD [address] [place],  [place] [address] PCP: Nedra Tinnie LABOR, NP  Chief Complaint  Patient presents with   Left Ankle - Pain      HPI: Discussed the use of AI scribe software for clinical note transcription with the patient, who gave verbal consent to proceed.  History of Present Illness Chelsea Walsh is a 69 year old female with left posterior tibial tendon insufficiency, acquired flatfoot, and left ankle and foot osteoarthritis who presents with chronic left ankle pain and swelling.  She reports chronic left ankle pain and swelling, with progressive worsening over time. Pain is most pronounced medially and within the ankle joint, and swelling increases throughout the day, particularly with prolonged standing. The area of greatest discomfort is the medial aspect of the ankle, and swelling occurs bilaterally around the joint.  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  She has a history of left ankle injuries, including fractures in high school [phone]) and in 2012. Her most recent injury occurred over a year ago when she rolled her ankle descending steps at work. She was previously provided with an ankle brace after the 2012 injury, but her primary care physician recommended compression therapy for swelling.  She currently wears knee-high compression socks daily, which she finds helpful for swelling. She has attempted shoes with arch support, but reports they are completely flat and have not provided relief. She denies any acute changes or systemic symptoms.     Assessment & Plan: Visit Diagnoses:  1. 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 Surgical fusion discussed but not advised due to recovery demands. - Recommended over-the-counter cork sole orthotic with red top for arch support and tendon unloading. - Instructed to replace sneaker insert with orthotic. - Discussed lace-up ankle support (ASO) if orthotic insufficient. - Advised follow-up if symptoms worsen.  Osteoarthritis, left ankle and foot Mild osteoarthritic changes in left ankle and foot, exacerbated by flatfoot deformity. - Recommended conservative management with orthotic support and ankle bracing.  Chronic venous insufficiency with pitting edema, left leg Chronic venous insufficiency causing pitting edema. - Recommended knee-high compression socks for swelling management. - Confirmed daily use of compression socks.      Follow-Up Instructions: Return if symptoms worsen or fail to improve.   Ortho Exam  Patient is alert, oriented, no adenopathy, well-dressed, normal affect, normal respiratory effort. Physical Exam CARDIOVASCULAR: Palpable dorsalis pedis pulse. EXTREMITIES: Venous insufficiency with pitting edema. MUSCULOSKELETAL: Pronation and valgus of the foot due to posterior tibial tendon insufficiency. Pain to palpation over the sinus tarsi.      Imaging: No results found. No images are attached to the encounter.  Labs: No results found for: HGBA1C, ESRSEDRATE, CRP, LABURIC, REPTSTATUS, GRAMSTAIN, CULT, LABORGA   Lab Results  Component Value Date   ALBUMIN 4.2 09/15/2024   ALBUMIN  4.4 03/03/2024   ALBUMIN 3.8 07/06/2009    No results found for: MG Lab Results  Component Value Date   VD25OH 22.69 (L) 09/15/2024   VD25OH <7.00 (L) 03/03/2024     No results found for: PREALBUMIN    Latest Ref Rng & Units 09/15/2024    8:33 AM 03/03/2024   10:42 AM 07/06/2009    7:40 PM  CBC EXTENDED  WBC 4.0 - 10.5 K/uL 5.9  6.2    RBC 3.87 - 5.11 Mil/uL 4.39  4.42    Hemoglobin 12.0 - 15.0 g/dL 86.7  86.5  86.6   HCT 36.0 - 46.0 % 39.9  40.2  39.0   Platelets 150.0 - 400.0 K/uL 313.0  294.0    NEUT# 1.4 - 7.7 K/uL 3.6  3.7    Lymph# 0.7 - 4.0 K/uL 1.5  1.8       There is no height or weight on file to calculate BMI.  Orders:  No orders of the defined types were placed in this encounter.  No orders of the defined types were placed in this encounter.    Procedures: No procedures performed  Clinical Data: No additional findings.  ROS:  All other systems negative, except as noted in the HPI. Review of Systems  Objective: Vital Signs: There were no vitals taken for this visit.  Specialty Comments:  No specialty comments available.  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   Allergic rhinitis 10/27/2011   Seizure disorder (HCC) 10/27/2011   Past Medical History:  Diagnosis Date   Allergy    Epilepsy (HCC)    last seizure 2000   Migraine headache    Osteopenia    Osteoporosis    Seizures (HCC)    02/27/16 about 16 yrs since last sz    Family History  Problem Relation Age of Onset   Alcohol abuse Father    Diabetes Father    Seizures Neg Hx     Past Surgical History:  Procedure Laterality Date   CESAREAN SECTION  1979   First pregnancy   TUBAL LIGATION  1989   Last pregnancy   Social History   Occupational History   Occupation: Conservation Officer, Nature: OTHER    Comment: collect and Copywriter, Advertising: OTHER     Comment: [employer]  Tobacco Use   Smoking status: Former    Current packs/day: 0.00    Average packs/day: 0.7 packs/day for 4.0 years (2.8 ttl pk-yrs)    Types: Cigarettes    Start date: 02/03/2009    Quit date: 02/03/2013    Years since quitting: 11.7   Smokeless tobacco: Never   Tobacco comments:    Was never a heavy smoker and have not smoked since 2013.  Vaping Use   Vaping status: Not on file  Substance and Sexual Activity   Alcohol use: Not Currently   Drug use: No   Sexual activity: Not Currently    Birth control/protection: Post-menopausal         "

## 2025-03-16 ENCOUNTER — Ambulatory Visit: Admitting: Nurse Practitioner
